# Patient Record
Sex: Male | Born: 1965 | Race: White | Hispanic: No | Marital: Married | Smoking: Never smoker
Health system: Southern US, Community
[De-identification: ages and names within clinical notes are randomized; demographics above are authoritative.]

## PROBLEM LIST (undated history)

## (undated) DIAGNOSIS — I639 Cerebral infarction, unspecified: Secondary | ICD-10-CM

## (undated) DIAGNOSIS — M549 Dorsalgia, unspecified: Secondary | ICD-10-CM

## (undated) DIAGNOSIS — I10 Essential (primary) hypertension: Secondary | ICD-10-CM

## (undated) DIAGNOSIS — M1990 Unspecified osteoarthritis, unspecified site: Secondary | ICD-10-CM

## (undated) DIAGNOSIS — E119 Type 2 diabetes mellitus without complications: Secondary | ICD-10-CM

## (undated) DIAGNOSIS — G4730 Sleep apnea, unspecified: Secondary | ICD-10-CM

## (undated) HISTORY — PX: CARDIAC CATHETERIZATION: SHX172

---

## 2004-06-30 ENCOUNTER — Inpatient Hospital Stay (HOSPITAL_COMMUNITY): Admission: AD | Admit: 2004-06-30 | Discharge: 2004-07-02 | Payer: Self-pay | Admitting: Cardiology

## 2004-06-30 ENCOUNTER — Ambulatory Visit: Payer: Self-pay | Admitting: Cardiology

## 2004-06-30 ENCOUNTER — Encounter: Payer: Self-pay | Admitting: Emergency Medicine

## 2004-07-14 ENCOUNTER — Ambulatory Visit: Payer: Self-pay

## 2006-02-09 ENCOUNTER — Emergency Department (HOSPITAL_COMMUNITY): Admission: EM | Admit: 2006-02-09 | Discharge: 2006-02-09 | Payer: Self-pay | Admitting: Emergency Medicine

## 2006-02-10 ENCOUNTER — Ambulatory Visit: Payer: Self-pay | Admitting: Cardiovascular Disease

## 2006-02-10 ENCOUNTER — Emergency Department (HOSPITAL_COMMUNITY): Admission: EM | Admit: 2006-02-10 | Discharge: 2006-02-10 | Payer: Self-pay | Admitting: Emergency Medicine

## 2014-01-18 ENCOUNTER — Emergency Department (HOSPITAL_COMMUNITY): Payer: BC Managed Care – PPO

## 2014-01-18 ENCOUNTER — Emergency Department (HOSPITAL_COMMUNITY)
Admission: EM | Admit: 2014-01-18 | Discharge: 2014-01-18 | Disposition: A | Discharge status: Home / Self Care | Payer: BC Managed Care – PPO | Attending: Emergency Medicine | Admitting: Emergency Medicine

## 2014-01-18 ENCOUNTER — Encounter (HOSPITAL_COMMUNITY): Payer: Self-pay | Admitting: Emergency Medicine

## 2014-01-18 DIAGNOSIS — M549 Dorsalgia, unspecified: Secondary | ICD-10-CM | POA: Diagnosis present

## 2014-01-18 DIAGNOSIS — M545 Low back pain, unspecified: Secondary | ICD-10-CM | POA: Diagnosis not present

## 2014-01-18 DIAGNOSIS — M62838 Other muscle spasm: Secondary | ICD-10-CM | POA: Insufficient documentation

## 2014-01-18 DIAGNOSIS — M62830 Muscle spasm of back: Secondary | ICD-10-CM

## 2014-01-18 HISTORY — DX: Type 2 diabetes mellitus without complications: E11.9

## 2014-01-18 HISTORY — DX: Essential (primary) hypertension: I10

## 2014-01-18 MED ORDER — METHOCARBAMOL 1000 MG/10ML IJ SOLN
1000.0000 mg | Freq: Once | INTRAMUSCULAR | Status: DC
  Filled 2014-01-18: qty 10

## 2014-01-18 MED ORDER — METHOCARBAMOL 500 MG PO TABS
1000.0000 mg | ORAL_TABLET | Freq: Once | ORAL | Status: AC
  Administered 2014-01-18: 1000 mg via ORAL
  Filled 2014-01-18: qty 2

## 2014-01-18 MED ORDER — KETOROLAC TROMETHAMINE 30 MG/ML IJ SOLN
30.0000 mg | Freq: Once | INTRAMUSCULAR | Status: AC
  Administered 2014-01-18: 30 mg via INTRAVENOUS
  Filled 2014-01-18: qty 1

## 2014-01-18 MED ORDER — DIAZEPAM 5 MG/ML IJ SOLN
5.0000 mg | Freq: Once | INTRAMUSCULAR | Status: AC
Start: 2014-01-18 — End: 2014-01-18
  Administered 2014-01-18: 5 mg via INTRAVENOUS
  Filled 2014-01-18: qty 2

## 2014-01-18 MED ORDER — METHOCARBAMOL 500 MG PO TABS: 500.0000 mg | ORAL_TABLET | Freq: Two times a day (BID) | ORAL | Status: DC

## 2014-01-18 MED ORDER — NAPROXEN 500 MG PO TABS: 500.0000 mg | ORAL_TABLET | Freq: Two times a day (BID) | ORAL | Status: DC

## 2014-01-18 MED ORDER — OXYCODONE-ACETAMINOPHEN 5-325 MG PO TABS: 1.0000 | ORAL_TABLET | Freq: Four times a day (QID) | ORAL | Status: DC | PRN

## 2014-01-18 MED ORDER — OXYCODONE-ACETAMINOPHEN 5-325 MG PO TABS
1.0000 | ORAL_TABLET | Freq: Four times a day (QID) | ORAL | Status: DC | PRN
Start: 2014-01-18 — End: 2014-02-14

## 2014-01-18 MED ORDER — DEXTROSE 5 % IV SOLN
1000.0000 mg | Freq: Once | INTRAVENOUS | Status: AC
  Administered 2014-01-18: 1000 mg via INTRAVENOUS
  Filled 2014-01-18: qty 10

## 2014-01-18 MED ORDER — HYDROMORPHONE HCL PF 1 MG/ML IJ SOLN
1.0000 mg | Freq: Once | INTRAMUSCULAR | Status: AC
  Administered 2014-01-18: 1 mg via INTRAVENOUS
  Filled 2014-01-18: qty 1

## 2014-01-18 MED ORDER — METHOCARBAMOL 500 MG PO TABS: 500.0000 mg | ORAL_TABLET | Freq: Once | ORAL | Status: DC

## 2014-01-18 MED ORDER — LORAZEPAM 2 MG/ML IJ SOLN: 1.0000 mg | Freq: Once | INTRAMUSCULAR | Status: DC

## 2014-01-18 NOTE — ED Notes (Signed)
Patient being transported to MRI, by transporter.

## 2014-01-18 NOTE — ED Notes (Signed)
Awaiting pt arrival from [facility].

## 2014-01-18 NOTE — ED Notes (Signed)
Pt transported to MRI 

## 2014-01-18 NOTE — ED Provider Notes (Signed)
Medical screening examination/treatment/procedure(s) were performed by non-physician practitioner and as supervising physician I was immediately available for consultation/collaboration.   EKG Interpretation None       Geovanni Rahming M Jenavie Stanczak, MD 01/18/14 0630 

## 2014-01-18 NOTE — ED Notes (Signed)
Patient ambulated from his room to the restroom with aid of a walker for safety. Gait steady . States "it feels much better" denies feeling weakness in his legs. Able to sit on toilet and stand without assistance. Tolerated well.

## 2014-01-18 NOTE — ED Notes (Signed)
MRI reports will be here to transport pt in 10 minutes to MRI.

## 2014-01-18 NOTE — ED Notes (Signed)
Pt being transported to CT at present time.

## 2014-01-18 NOTE — ED Notes (Addendum)
Pt lying on side. Pt denies pain at present time but reports pain is positional and has pain with laying on back.

## 2014-01-18 NOTE — ED Notes (Signed)
Bed: [id number] Expected date:  Expected time:  Means of arrival:  Comments: EMS 48yo M, fall while playing paintball, back, knee and shoulder pain

## 2014-01-18 NOTE — ED Provider Notes (Signed)
CSN: [id number]     Arrival date & time 01/18/14  0219 History   First MD Initiated Contact with Patient 01/18/14 0221     Chief Complaint  Patient presents with  . Fall  . Back Pain    (Consider location/radiation/quality/duration/timing/severity/associated sxs/prior Treatment) HPI Comments: Patient is a 48 year old male with a history of diabetes mellitus and hypertension who presents to the emergency department for further evaluation of low back pain. Patient states that he was playing laser tag with his grandchildren when he slid down an incline on his knees, only stopping when he hit a wall with his right shoulder. Patient states that this occurred yesterday at approximately 2200. He states he went to dinner afterwards and was able to ambulate without difficulty. Patient states the pain acutely worsened earlier this morning. Patient states that pain is constant and stabbing in nature. It is sharp and without alleviating factors. Patient states that any movement of his legs worsens his back pain. He also endorses worsening with palpation to his low back. Patient has been experiencing some subjective numbness in his bilateral feet different from his peripheral neuropathy. He denies associated head trauma, loss of consciousness, complete loss of sensation in his limbs, perianal numbness, genital anesthesia, and bowel/bladder incontinence. He denies a hx of low back pain.  Patient is a 48 y.o. male presenting with fall and back pain. The history is provided by the patient. No language interpreter was used.  Fall Associated symptoms include numbness.  Back Pain Associated symptoms: numbness     Past Medical History  Diagnosis Date  . Hypertension   . Diabetes mellitus without complication    History reviewed. No pertinent past surgical history. History reviewed. No pertinent family history. History  Substance Use Topics  . Smoking status: Never Smoker   . Smokeless tobacco: Never Used    . Alcohol Use: Yes    Review of Systems  Genitourinary:       No incontinence  Musculoskeletal: Positive for back pain.  Neurological: Positive for numbness. Negative for syncope.     Allergies  Review of patient's allergies indicates no known allergies.  Home Medications   Prior to Admission medications   Medication Sig Start Date End Date Taking? Authorizing Provider  atorvastatin (LIPITOR) 10 MG tablet Take 10 mg by mouth as directed. Patient takes cyclicly for 2 days then off 1 day   Yes Historical Provider, MD  lisinopril-hydrochlorothiazide (PRINZIDE,ZESTORETIC) 10-12.5 MG per tablet Take 1 tablet by mouth daily.   Yes Historical Provider, MD  metFORMIN (GLUCOPHAGE) 500 MG tablet Take 500 mg by mouth 2 (two) times daily with a meal.   Yes Historical Provider, MD  Multiple Vitamin (MULTIVITAMIN WITH MINERALS) TABS tablet Take 1 tablet by mouth daily. 50 plus.   Yes Historical Provider, MD   BP 118/72  Pulse 66  Temp(Src) 98.5 F (36.9 C) (Oral)  Resp 20  SpO2 97%  Physical Exam  Nursing note and vitals reviewed. Constitutional: He is oriented to person, place, and time. He appears well-developed and well-nourished. No distress.  Nontoxic/nonseptic appearing. Patient very uncomfortable in exam room bed.  HENT:  Head: Normocephalic and atraumatic.  Eyes: Conjunctivae and EOM are normal. No scleral icterus.  Neck: Normal range of motion.  Cardiovascular: Normal rate, regular rhythm and intact distal pulses.   DP and PT pulses b/l  Pulmonary/Chest: Effort normal. No respiratory distress.  Musculoskeletal: He exhibits tenderness.       Lumbar back: He exhibits decreased range of  motion, tenderness, bony tenderness and pain. He exhibits no deformity.       Back:  Physical exam limited as any palpation to low back causes extreme discomfort. No bony deformities or step offs noted.  Neurological: He is alert and oriented to person, place, and time. He exhibits normal  muscle tone. Coordination normal.  Sensation to light touch intact in b/l lower extremities.  Skin: Skin is warm and dry. No rash noted. He is not diaphoretic. No erythema. No pallor.  Psychiatric: He has a normal mood and affect. His behavior is normal.    ED Course  Procedures (including critical care time) Labs Review Labs Reviewed - No data to display  Imaging Review Dg Lumbar Spine Complete  01/18/2014   CLINICAL DATA:  Fall, back pain  EXAM: LUMBAR SPINE - COMPLETE 4+ VIEW  COMPARISON:  None.  FINDINGS: Five non rib-bearing lumbar type vertebral bodies are present. Vertebral bodies are normally aligned with preservation of the normal lumbar lordosis. There is mild anterior wedging of the T11, T12, and L1 vertebral bodies, favored to be chronic in nature. No definite acute fracture identified. There is no listhesis.  Multilevel degenerative disc disease is evidenced by intervertebral disc space narrowing, endplate sclerosis, and osteophytosis is seen, most prevalent at T12-L1 and L3-4. Multilevel facet arthropathy present within the lower lumbar spine.  No paraspinous soft tissue abnormality.  IMPRESSION: 1. Mild anterior wedging deformity of the T11, T12, and L1 vertebral bodies, favored to be chronic in nature. Correlation with physical exam and site of pain is recommended. In addition, further evaluation with CT could be performed if findings remain equivocal. 2. Moderate multilevel degenerative disc disease as above.   Electronically Signed   By: Rise Mu M.D.   On: 01/18/2014 03:57   Ct Lumbar Spine Wo Contrast  01/18/2014   CLINICAL DATA:  Fall, back pain.  EXAM: CT LUMBAR SPINE WITHOUT CONTRAST  TECHNIQUE: Multidetector CT imaging of the lumbar spine was performed without intravenous contrast administration. Multiplanar CT image reconstructions were also generated.  COMPARISON:  Prior radiograph performed earlier on the same day.  FINDINGS: For the purposes of this dictation,  the lowest well-formed intervertebral disc spaces presumed to be the L5-S1 level, and there presumed to be 5 lumbar type vertebral bodies.  Vertebral bodies are normally aligned with preservation of the normal lumbar lordosis. Previously seen mild anterior wedging of the T11, T12, and L1 vertebral bodies is again seen, which appears chronic on this study. No cortical irregularity to suggest acute fracture identified. Vertebral body heights are otherwise preserved.  Prominent bridging anterior osteophytes seen within the lower thoracic and upper lumbar spine anteriorly. Degenerative intervertebral disc space narrowing present at multiple levels. There is degenerative vacuum disc phenomena at L3-4, L4-5, and L5-S1. Degenerative disc bulging present at L4-5 and L5-S1.  No paraspinous hematoma or other abnormality.  SI joints are approximated.  IMPRESSION: 1. No CT evidence of acute traumatic injury within the lumbar spine. Previously seen mild anterior wedging deformity of the T11, T12, and L1 vertebral bodies appears chronic in nature. 2. Moderate multilevel degenerative disc disease throughout the lumbar spine.   Electronically Signed   By: Rise Mu M.D.   On: 01/18/2014 04:49     EKG Interpretation None      MDM   Final diagnoses:  Spasm of back muscles  Low back pain without sciatica, unspecified back pain laterality    48 year old male presents to the emergency department for back pain  after a fall while playing laser tag with his grandchildren. Physical exam significant for tenderness to lumbar paraspinal muscles bilaterally. No bony deformities or step-offs palpated. Patient neurovascularly intact on exam. No gross sensory deficits. No red flags or signs concerning for cauda equina.  Imaging today is negative for fracture, dislocation, or bony deformity. Symptoms consistent with muscle spasms/strain of low back. Patient treated in ED with Toradol, Dilaudid, and Valium. As a result of  these medications, patient is currently very drowsy. He states his pain has greatly improved while at rest, though he still has some discomfort with movement and palpation. Will continue with symptom management with oral Robaxin.  [facility]-C at shift change who will reevaluate patient after effects of Valium have worn off. Anticipate d/c with Rx for pain medication, NSAIDs, and Robaxin if patient able to ambulate in ED.   Filed Vitals:   01/18/14 0219 01/18/14 0220  BP: 135/78 118/72  Pulse: 85 66  Temp:  98.5 F (36.9 C)  TempSrc:  Oral  Resp:  20  SpO2: 97% 97%      Antony MaduraKelly Davin Archuletta, PA-C 01/18/14 [phone]

## 2014-01-18 NOTE — ED Notes (Addendum)
Pt arrived to the ED with a complaint of a fall with a resulting lower back, right shoulder and right leg injury.  Pt was playing laser tag when he came to decline.  Pt then fell and slid down the incline on his knees stopping when he hit a wall with his shoulder.  Pt did this around 2200 hours yesterday.  Pt then went to dinner.  Pt began experiencing pain later mostly in his lower back.  Pt states he is having difficulty using his right leg.  Pt states the pain is his shoulder is not as bad as his back.  Pt was given 250 mcg of fentanyl in route by EMS

## 2014-01-18 NOTE — ED Provider Notes (Signed)
[facility] after complaining of back pain after a fall playing laser tag with R leg weakness.   3:24 PM Pt has just returned from MRI, reporting 8/10 low back pain, R>L. He reports nml sensation in feet but dec strength in R leg.   4:12 PM MRI without abnml cord signal or acute findings. Will d/c home per PA-Humes originally instructions w/ norco, naproxen, and robaxin.   1. Spasm of back muscles   2. Low back pain without sciatica, unspecified back pain laterality      Shanna CiscoMegan E Docherty, MD 01/18/14 [phone]

## 2014-01-18 NOTE — ED Notes (Signed)
[facility] transporting pt at present time.

## 2014-01-18 NOTE — ED Notes (Signed)
Called [facility]ri and they are aware of the patient. They request a weight on pt.

## 2014-01-18 NOTE — ED Notes (Signed)
[facility]oke to [facility]. They advise will be about 30 more minutes before they are ready for pt

## 2014-01-18 NOTE — ED Provider Notes (Signed)
6:08 AM Patient signed out to me by Philip MaduraKelly Humes, PA-C. Patient will be discharged when he is able to ambulate after pain medication wears off.   8:13 AM Patient is able to stand and reports feeling "much better." Patient will be discharged with pain medication. Patient instructed to follow up with PCP or return to the ED with worsening or concerning symptoms. No bladder/bowel incontinence or saddle paresthesias.   9:02 AM Patient reports dragging his right leg when he walks. Patient reports "my right leg isn't doing what I tell it to do." Patient's lower extremity sensation intact, however, right leg strength severely limited. Patient will have MR lumbar and thoracic spine and CT head.   Patient's CT head unremarkable for acute changes. Patient is too large to fit in the MRI machine. [facility]. [facility] is aware the patient will be coming there.   No results found for this or any previous visit. Dg Lumbar Spine Complete  01/18/2014   CLINICAL DATA:  Fall, back pain  EXAM: LUMBAR SPINE - COMPLETE 4+ VIEW  COMPARISON:  None.  FINDINGS: Five non rib-bearing lumbar type vertebral bodies are present. Vertebral bodies are normally aligned with preservation of the normal lumbar lordosis. There is mild anterior wedging of the T11, T12, and L1 vertebral bodies, favored to be chronic in nature. No definite acute fracture identified. There is no listhesis.  Multilevel degenerative disc disease is evidenced by intervertebral disc space narrowing, endplate sclerosis, and osteophytosis is seen, most prevalent at T12-L1 and L3-4. Multilevel facet arthropathy present within the lower lumbar spine.  No paraspinous soft tissue abnormality.  IMPRESSION: 1. Mild anterior wedging deformity of the T11, T12, and L1 vertebral bodies, favored to be chronic in nature. Correlation with physical exam and site of pain is recommended. In addition, further  evaluation with CT could be performed if findings remain equivocal. 2. Moderate multilevel degenerative disc disease as above.   Electronically Signed   By: Rise MuBenjamin  McClintock M.D.   On: 01/18/2014 03:57   Ct Head Wo Contrast  01/18/2014   CLINICAL DATA:  Philip Beasley yesterday. Hit back of head. Right leg weakness and numbness. Diabetic. Hypertension.  EXAM: CT HEAD WITHOUT CONTRAST  TECHNIQUE: Contiguous axial images were obtained from the base of the skull through the vertex without intravenous contrast.  COMPARISON:  11/04/2005.  FINDINGS: No skull fracture or intracranial hemorrhage.  No CT evidence of large acute infarct.  No hydrocephalus.  No intracranial mass lesion noted on this unenhanced exam.  Mastoid air cells, middle ear cavities and visualized paranasal sinuses are clear.  Orbital structures unremarkable.  IMPRESSION: No acute abnormality.  Please see above.   Electronically Signed   By: Bridgett LarssonSteve  Olson M.D.   On: 01/18/2014 10:33   Ct Lumbar Spine Wo Contrast  01/18/2014   CLINICAL DATA:  Fall, back pain.  EXAM: CT LUMBAR SPINE WITHOUT CONTRAST  TECHNIQUE: Multidetector CT imaging of the lumbar spine was performed without intravenous contrast administration. Multiplanar CT image reconstructions were also generated.  COMPARISON:  Prior radiograph performed earlier on the same day.  FINDINGS: For the purposes of this dictation, the lowest well-formed intervertebral disc spaces presumed to be the L5-S1 level, and there presumed to be 5 lumbar type vertebral bodies.  Vertebral bodies are normally aligned with preservation of the normal lumbar lordosis. Previously seen mild anterior wedging of the T11, T12, and L1 vertebral bodies is again  seen, which appears chronic on this study. No cortical irregularity to suggest acute fracture identified. Vertebral body heights are otherwise preserved.  Prominent bridging anterior osteophytes seen within the lower thoracic and upper lumbar spine anteriorly.  Degenerative intervertebral disc space narrowing present at multiple levels. There is degenerative vacuum disc phenomena at L3-4, L4-5, and L5-S1. Degenerative disc bulging present at L4-5 and L5-S1.  No paraspinous hematoma or other abnormality.  SI joints are approximated.  IMPRESSION: 1. No CT evidence of acute traumatic injury within the lumbar spine. Previously seen mild anterior wedging deformity of the T11, T12, and L1 vertebral bodies appears chronic in nature. 2. Moderate multilevel degenerative disc disease throughout the lumbar spine.   Electronically Signed   By: Rise Mu M.D.   On: 01/18/2014 04:49      [name], PA-C 01/18/14 0815  [name], PA-C 01/18/14 1523

## 2014-01-18 NOTE — ED Provider Notes (Signed)
Medical screening examination/treatment/procedure(s) were performed by non-physician practitioner and as supervising physician I was immediately available for consultation/collaboration.   EKG Interpretation None       Octavian Godek M Mak Bonny, MD 01/18/14 1558 

## 2014-01-18 NOTE — Discharge Instructions (Signed)
Back Pain, Adult °Low back pain is very common. About 1 in 5 people have back pain. The cause of low back pain is rarely dangerous. The pain often gets better over time. About half of people with a sudden onset of back pain feel better in just 2 weeks. About 8 in 10 people feel better by 6 weeks.  °CAUSES °Some common causes of back pain include: °· Strain of the muscles or ligaments supporting the spine. °· Wear and tear (degeneration) of the spinal discs. °· Arthritis. °· Direct injury to the back. °DIAGNOSIS °Most of the time, the direct cause of low back pain is not known. However, back pain can be treated effectively even when the exact cause of the pain is unknown. Answering your caregiver's questions about your overall health and symptoms is one of the most accurate ways to make sure the cause of your pain is not dangerous. If your caregiver needs more information, he or she may order lab work or imaging tests (X-rays or MRIs). However, even if imaging tests show changes in your back, this usually does not require surgery. °HOME CARE INSTRUCTIONS °For many people, back pain returns. Since low back pain is rarely dangerous, it is often a condition that people can learn to manage on their own.  °· Remain active. It is stressful on the back to sit or stand in one place. Do not sit, drive, or stand in one place for more than 30 minutes at a time. Take short walks on level surfaces as soon as pain allows. Try to increase the length of time you walk each day. °· Do not stay in bed. Resting more than 1 or 2 days can delay your recovery. °· Do not avoid exercise or work. Your body is made to move. It is not dangerous to be active, even though your back may hurt. Your back will likely heal faster if you return to being active before your pain is gone. °· Pay attention to your body when you  bend and lift. Many people have less discomfort when lifting if they bend their knees, keep the load close to their bodies, and  avoid twisting. Often, the most comfortable positions are those that put less stress on your recovering back. °· Find a comfortable position to sleep. Use a firm mattress and lie on your side with your knees slightly bent. If you lie on your back, put a pillow under your knees. °· Only take over-the-counter or prescription medicines as directed by your caregiver. Over-the-counter medicines to reduce pain and inflammation are often the most helpful. Your caregiver may prescribe muscle relaxant drugs. These medicines help dull your pain so you can more quickly return to your normal activities and healthy exercise. °· Put ice on the injured area. °· Put ice in a plastic bag. °· Place a towel between your skin and the bag. °· Leave the ice on for 15-20 minutes, 03-04 times a day for the first 2 to 3 days. After that, ice and heat may be alternated to reduce pain and spasms. °· Ask your caregiver about trying back exercises and gentle massage. This may be of some benefit. °· Avoid feeling anxious or stressed. Stress increases muscle tension and can worsen back pain. It is important to recognize when you are anxious or stressed and learn ways to manage it. Exercise is a great option. °SEEK MEDICAL CARE IF: °· You have pain that is not relieved with rest or medicine. °· You have pain that does not improve in 1 week. °· You have new symptoms. °· You are generally not feeling well. °SEEK   IMMEDIATE MEDICAL CARE IF:  °· You have pain that radiates from your back into your legs. °· You develop new bowel or bladder control problems. °· You have unusual weakness or numbness in your arms or legs. °· You develop nausea or vomiting. °· You develop abdominal pain. °· You feel faint. °Document Released: 06/08/2005 Document Revised: 12/08/2011 Document Reviewed: 10/10/2013 °ExitCare® Patient Information ©2015 [employer]. This information is not intended to replace advice given to you by your health care provider. Make sure you  discuss any questions you have with your health care provider. ° °Muscle Cramps and Spasms °Muscle cramps and spasms occur when a muscle or muscles tighten and you have no control over this tightening (involuntary muscle contraction). They are a common problem and can develop in any muscle. The most common place is in the calf muscles of the leg. Both muscle cramps and muscle spasms are involuntary muscle contractions, but they also have differences:  °· Muscle cramps are sporadic and painful. They may last a few seconds to a quarter of an hour. Muscle cramps are often more forceful and last longer than muscle spasms. °· Muscle spasms may or may not be painful. They may also last just a few seconds or much longer. °CAUSES  °It is uncommon for cramps or spasms to be due to a serious underlying problem. In many cases, the cause of cramps or spasms is unknown. Some common causes are:  °· Overexertion.   °· Overuse from repetitive motions (doing the same thing over and over).   °· Remaining in a certain position for a long period of time.   °· Improper preparation, form, or technique while performing a sport or activity.   °· Dehydration.   °· Injury.   °· Side effects of some medicines.   °· Abnormally low levels of the salts and ions in your blood (electrolytes), especially potassium and calcium. This could happen if you are taking water pills (diuretics) or you are pregnant.   °Some underlying medical problems can make it more likely to develop cramps or spasms. These include, but are not limited to:  °· Diabetes.   °· Parkinson disease.   °· Hormone disorders, such as thyroid problems.   °· Alcohol abuse.   °· Diseases specific to muscles, joints, and bones.   °· Blood vessel disease where not enough blood is getting to the muscles.   °HOME CARE INSTRUCTIONS  °· Stay well hydrated. Drink enough water and fluids to keep your urine clear or pale yellow. °· It may be helpful to massage, stretch, and relax the affected  muscle. °· For tight or tense muscles, use a warm towel, heating pad, or hot shower water directed to the affected area. °· If you are sore or have pain after a cramp or spasm, applying ice to the affected area may relieve discomfort. °¨ Put ice in a plastic bag. °¨ Place a towel between your skin and the bag. °¨ Leave the ice on for 15-20 minutes, 03-04 times a day. °· Medicines used to treat a known cause of cramps or spasms may help reduce their frequency or severity. Only take over-the-counter or prescription medicines as directed by your caregiver. °SEEK MEDICAL CARE IF:  °Your cramps or spasms get more severe, more frequent, or do not improve over time.  °MAKE SURE YOU:  °· Understand these instructions. °· Will watch your condition. °· Will get help right away if you are not doing well or get worse. °Document Released: 11/28/2001 Document Revised: 10/03/2012 Document Reviewed: 05/25/2012 °ExitCare® Patient Information ©2015 [employer].   This information is not intended to replace advice given to you by your health care provider. Make sure you discuss any questions you have with your health care provider. ° °

## 2014-01-18 NOTE — ED Notes (Addendum)
IV not documented by previous shift de accessed at present time.

## 2014-01-18 NOTE — ED Notes (Addendum)
Pt ambulatory but reports unable to lift right leg. Pt denies pain in right foot but reports shooting pain to lower back with weight bearing to right foot and leg. [facility] and reports will reassess pt with possible additional tests prior to discharge.

## 2014-01-31 ENCOUNTER — Encounter (INDEPENDENT_AMBULATORY_CARE_PROVIDER_SITE_OTHER): Payer: Self-pay | Admitting: General Surgery

## 2014-01-31 ENCOUNTER — Ambulatory Visit (INDEPENDENT_AMBULATORY_CARE_PROVIDER_SITE_OTHER): Payer: BC Managed Care – PPO | Admitting: General Surgery

## 2014-01-31 DIAGNOSIS — IMO0001 Reserved for inherently not codable concepts without codable children: Secondary | ICD-10-CM

## 2014-01-31 DIAGNOSIS — G4733 Obstructive sleep apnea (adult) (pediatric): Secondary | ICD-10-CM

## 2014-01-31 DIAGNOSIS — E119 Type 2 diabetes mellitus without complications: Secondary | ICD-10-CM

## 2014-01-31 DIAGNOSIS — Z794 Long term (current) use of insulin: Secondary | ICD-10-CM

## 2014-01-31 NOTE — Progress Notes (Signed)
Subjective:   Morbid obesity  Patient ID: Philip Beasley, male   DOB: August 26, 1965, 48 y.o.   MRN: [id number]  HPI Philip Beasley y.o.male presents for consideration for surgical treatment for morbid obesity.  He  gives a history of progressive obesity since Early adolescence despite multiple attempts at medical management. He has been able to lose up to 100 pounds at a time but then has progressive weight regain. his weight has been affecting him in a number of ways including The onset of insulin dependent diabetes mellitus approximately 6 years ago.  He also suffers from obstructive sleep apnea and has chronic joint pain. He is very concerned about his health owing to forward at his current weight. His wife has had successful gastric bypass surgery in [place] in 2001 markedly improved health..   He has been to our initial information seminar, researched surgical options thoroughly and is interested in Gastric bypass  Past Medical History  Diagnosis Date  . Hypertension   . Diabetes mellitus without complication    History reviewed. No pertinent past surgical history. Current Outpatient Prescriptions  Medication Sig Dispense Refill  . atorvastatin (LIPITOR) 10 MG tablet Take 10 mg by mouth as directed. Patient takes cyclicly for 2 days then off 1 day      . lisinopril-hydrochlorothiazide (PRINZIDE,ZESTORETIC) 10-12.5 MG per tablet Take 1 tablet by mouth daily.      . metFORMIN (GLUCOPHAGE) 500 MG tablet Take 500 mg by mouth 2 (two) times daily with a meal.      . methocarbamol (ROBAXIN) 500 MG tablet Take 1 tablet (500 mg total) by mouth 2 (two) times daily.  20 tablet  0  . Multiple Vitamin (MULTIVITAMIN WITH MINERALS) TABS tablet Take 1 tablet by mouth daily. 50 plus.      . naproxen (NAPROSYN) 500 MG tablet Take 1 tablet (500 mg total) by mouth 2 (two) times daily.  30 tablet  0  . oxyCODONE-acetaminophen (PERCOCET/ROXICET) 5-325 MG per tablet Take 1-2 tablets by mouth every 6 (six) hours as  needed for moderate pain or severe pain.  17 tablet  0   No current facility-administered medications for this visit.   No Known Allergies History  Substance Use Topics  . Smoking status: Never Smoker   . Smokeless tobacco: Never Used  . Alcohol Use: No       Review of Systems General: No fever malaise recent infections Respiratory: Denies shortness of breath cough wheezing.  Has sleep apnea and uses CPAP Cardiac: History of cardiac catheter after episode of chest pain. Reports negative and was noncardiac chest pain. No current palpitations swelling or chest pain Abdomen: Denies abdominal pain, reflux, change in bowel habits, liver problems or hepatitis Extremities: Positive for chronic joint pain GU: Negative Endocrine: Positive some dependent diabetes mellitus    Objective:   Physical Exam General: Alert, well-developed Morbidly obese Caucasian male, in no distress Skin: Warm and dry without rash or infection. HEENT: No palpable masses or thyromegaly. Sclera nonicteric. Pupils equal round and reactive. Oropharynx clear. Lymph nodes: No cervical, supraclavicular, or inguinal nodes palpable. Lungs: Breath sounds clear and equal without increased work of breathing Cardiovascular: Regular rate and rhythm without murmur. No JVD or edema. Peripheral pulses intact. Abdomen: Nondistended. Soft and nontender. No masses palpable. No organomegaly. No palpable hernias. Extremities: No edema or joint swelling or deformity. No chronic venous stasis changes. Neurologic: Alert and fully oriented. Gait normal.    Assessment:     Patient with progressive morbid obesity unresponsive  to multiple efforts at medical management who presents with a BMI of 39.7 and comorbidities of Insulin-dependent diabetes mellitus, obstructive sleep apnea, hypertension and chronic joint pain.. I believe there would be very significant medical benefit from surgical weight loss. After our discussion of surgical  options currently available the patient has decided to proceed with laparoscopic Roux-en-Y gastric bypass due to the reasons above. We have discussed the nature of the problem and the risks of remaining morbidly obese. We discussed laparoscopic Roux-en-Y gastric bypass in detail including the nature of the procedure, expected hospitalization and recovery, and major risks of anesthetic complications, bleeding, blood clots and pulmonary emboli, leakage and infection and long-term risks of stricture, ulceration, bowel obstruction, nutritional deficiencies, and failure to lose weight or weight regain. We discussed these problems could lead to death. The patient was given a complete consent form to review and all questions were answered. We will go ahead with preoperative including psychological and nutrition evaluations, H. pylori testing, ultrasound, bone density, and routine lab and x-rays. I will see the patient back following these studies.    Plan:     Begin workup for planned laparoscopic Roux-en-Y gastric bypass as above

## 2014-02-14 ENCOUNTER — Inpatient Hospital Stay (HOSPITAL_COMMUNITY)
Admission: EM | Admit: 2014-02-14 | Discharge: 2014-02-16 | DRG: 069 | Disposition: A | Discharge status: Home / Self Care | Payer: BC Managed Care – PPO | Attending: Internal Medicine | Admitting: Internal Medicine

## 2014-02-14 ENCOUNTER — Emergency Department (HOSPITAL_COMMUNITY): Payer: BC Managed Care – PPO

## 2014-02-14 ENCOUNTER — Encounter (HOSPITAL_COMMUNITY): Payer: Self-pay | Admitting: Emergency Medicine

## 2014-02-14 DIAGNOSIS — R29810 Facial weakness: Secondary | ICD-10-CM | POA: Diagnosis present

## 2014-02-14 DIAGNOSIS — E119 Type 2 diabetes mellitus without complications: Secondary | ICD-10-CM | POA: Diagnosis present

## 2014-02-14 DIAGNOSIS — R1310 Dysphagia, unspecified: Secondary | ICD-10-CM | POA: Diagnosis present

## 2014-02-14 DIAGNOSIS — I639 Cerebral infarction, unspecified: Secondary | ICD-10-CM

## 2014-02-14 DIAGNOSIS — W19XXXA Unspecified fall, initial encounter: Secondary | ICD-10-CM | POA: Diagnosis present

## 2014-02-14 DIAGNOSIS — E669 Obesity, unspecified: Secondary | ICD-10-CM | POA: Diagnosis present

## 2014-02-14 DIAGNOSIS — G2581 Restless legs syndrome: Secondary | ICD-10-CM | POA: Diagnosis present

## 2014-02-14 DIAGNOSIS — E781 Pure hyperglyceridemia: Secondary | ICD-10-CM | POA: Diagnosis present

## 2014-02-14 DIAGNOSIS — Z6839 Body mass index (BMI) 39.0-39.9, adult: Secondary | ICD-10-CM

## 2014-02-14 DIAGNOSIS — G459 Transient cerebral ischemic attack, unspecified: Secondary | ICD-10-CM | POA: Diagnosis not present

## 2014-02-14 DIAGNOSIS — G510 Bell's palsy: Secondary | ICD-10-CM

## 2014-02-14 DIAGNOSIS — I440 Atrioventricular block, first degree: Secondary | ICD-10-CM | POA: Diagnosis present

## 2014-02-14 DIAGNOSIS — R29898 Other symptoms and signs involving the musculoskeletal system: Secondary | ICD-10-CM | POA: Diagnosis present

## 2014-02-14 DIAGNOSIS — Z823 Family history of stroke: Secondary | ICD-10-CM | POA: Diagnosis not present

## 2014-02-14 DIAGNOSIS — I10 Essential (primary) hypertension: Secondary | ICD-10-CM | POA: Diagnosis present

## 2014-02-14 HISTORY — DX: Cerebral infarction, unspecified: I63.9

## 2014-02-14 LAB — RAPID URINE DRUG SCREEN, HOSP PERFORMED
AMPHETAMINES: NOT DETECTED
BARBITURATES: NOT DETECTED
Benzodiazepines: NOT DETECTED
Cocaine: NOT DETECTED
Opiates: NOT DETECTED
TETRAHYDROCANNABINOL: NOT DETECTED

## 2014-02-14 LAB — URINALYSIS, ROUTINE W REFLEX MICROSCOPIC
Bilirubin Urine: NEGATIVE
HGB URINE DIPSTICK: NEGATIVE
KETONES UR: NEGATIVE mg/dL
Leukocytes, UA: NEGATIVE
Nitrite: NEGATIVE
PROTEIN: NEGATIVE mg/dL
Specific Gravity, Urine: 1.026 (ref 1.005–1.030)
Urobilinogen, UA: 0.2 mg/dL (ref 0.0–1.0)
pH: 5.5 (ref 5.0–8.0)

## 2014-02-14 LAB — COMPREHENSIVE METABOLIC PANEL
ALT: 23 U/L (ref 0–53)
ANION GAP: 14 (ref 5–15)
AST: 18 U/L (ref 0–37)
Albumin: 3.7 g/dL (ref 3.5–5.2)
Alkaline Phosphatase: 84 U/L (ref 39–117)
BILIRUBIN TOTAL: 0.3 mg/dL (ref 0.3–1.2)
BUN: 19 mg/dL (ref 6–23)
CHLORIDE: 101 meq/L (ref 96–112)
CO2: 22 mEq/L (ref 19–32)
CREATININE: 0.59 mg/dL (ref 0.50–1.35)
Calcium: 9.1 mg/dL (ref 8.4–10.5)
GFR calc Af Amer: >90 mL/min (ref >90)
GFR calc non Af Amer: >90 mL/min (ref >90)
Glucose, Bld: 235 mg/dL — ABNORMAL HIGH (ref 70–99)
Potassium: 4.1 mEq/L (ref 3.7–5.3)
Sodium: 137 mEq/L (ref 137–147)
TOTAL PROTEIN: 7.2 g/dL (ref 6.0–8.3)

## 2014-02-14 LAB — I-STAT TROPONIN, ED: Troponin i, poc: 0 ng/mL (ref 0.00–0.08)

## 2014-02-14 LAB — DIFFERENTIAL
BASOS PCT: 0 % (ref 0–1)
Basophils Absolute: 0 10*3/uL (ref 0.0–0.1)
EOS ABS: 0.4 10*3/uL (ref 0.0–0.7)
Eosinophils Relative: 5 % (ref 0–5)
LYMPHS ABS: 2.6 10*3/uL (ref 0.7–4.0)
Lymphocytes Relative: 35 % (ref 12–46)
MONO ABS: 0.6 10*3/uL (ref 0.1–1.0)
Monocytes Relative: 8 % (ref 3–12)
Neutro Abs: 3.9 10*3/uL (ref 1.7–7.7)
Neutrophils Relative %: 52 % (ref 43–77)

## 2014-02-14 LAB — URINE MICROSCOPIC-ADD ON

## 2014-02-14 LAB — I-STAT VENOUS BLOOD GAS, ED
Bicarbonate: 25.9 mEq/L — ABNORMAL HIGH (ref 20.0–24.0)
O2 Saturation: 68 %
PCO2 VEN: 43.4 mmHg — AB (ref 45.0–50.0)
PH VEN: 7.383 — AB (ref 7.250–7.300)
PO2 VEN: 36 mmHg (ref 30.0–45.0)
TCO2: 27 mmol/L (ref 0–100)

## 2014-02-14 LAB — CBC
HEMATOCRIT: 37.8 % — AB (ref 39.0–52.0)
Hemoglobin: 13.8 g/dL (ref 13.0–17.0)
MCH: 30.9 pg (ref 26.0–34.0)
MCHC: 36.5 g/dL — AB (ref 30.0–36.0)
MCV: 84.6 fL (ref 78.0–100.0)
Platelets: 176 10*3/uL (ref 150–400)
RBC: 4.47 MIL/uL (ref 4.22–5.81)
RDW: 13.2 % (ref 11.5–15.5)
WBC: 7.4 10*3/uL (ref 4.0–10.5)

## 2014-02-14 LAB — GLUCOSE, CAPILLARY: GLUCOSE-CAPILLARY: 202 mg/dL — AB (ref 70–99)

## 2014-02-14 LAB — PROTIME-INR
INR: 1 (ref 0.00–1.49)
Prothrombin Time: 13.2 seconds (ref 11.6–15.2)

## 2014-02-14 LAB — APTT: aPTT: 30 seconds (ref 24–37)

## 2014-02-14 MED ORDER — ENOXAPARIN SODIUM 40 MG/0.4ML ~~LOC~~ SOLN
40.0000 mg | SUBCUTANEOUS | Status: DC
  Administered 2014-02-14 – 2014-02-15 (×2): 40 mg via SUBCUTANEOUS
  Filled 2014-02-14 (×2): qty 0.4

## 2014-02-14 MED ORDER — SODIUM CHLORIDE 0.9 % IJ SOLN
3.0000 mL | Freq: Two times a day (BID) | INTRAMUSCULAR | Status: DC
  Administered 2014-02-15: 3 mL via INTRAVENOUS

## 2014-02-14 MED ORDER — INSULIN ASPART 100 UNIT/ML ~~LOC~~ SOLN
0.0000 [IU] | Freq: Every day | SUBCUTANEOUS | Status: DC
  Administered 2014-02-14: 2 [IU] via SUBCUTANEOUS

## 2014-02-14 MED ORDER — SODIUM CHLORIDE 0.9 % IV SOLN
INTRAVENOUS | Status: DC
  Administered 2014-02-14: 1000 mL via INTRAVENOUS

## 2014-02-14 MED ORDER — POLYETHYLENE GLYCOL 3350 17 G PO PACK: 17.0000 g | PACK | Freq: Every day | ORAL | Status: DC | PRN

## 2014-02-14 MED ORDER — STROKE: EARLY STAGES OF RECOVERY BOOK
Freq: Once | Status: AC
  Administered 2014-02-14: 22:00:00
  Filled 2014-02-14: qty 1

## 2014-02-14 MED ORDER — ONDANSETRON HCL 4 MG/2ML IJ SOLN: 4.0000 mg | Freq: Four times a day (QID) | INTRAMUSCULAR | Status: DC | PRN

## 2014-02-14 MED ORDER — LISINOPRIL-HYDROCHLOROTHIAZIDE 10-12.5 MG PO TABS: 1.0000 | ORAL_TABLET | Freq: Every day | ORAL | Status: DC

## 2014-02-14 MED ORDER — HYDROCHLOROTHIAZIDE 12.5 MG PO CAPS
12.5000 mg | ORAL_CAPSULE | Freq: Every day | ORAL | Status: DC
  Administered 2014-02-16: 12.5 mg via ORAL
  Filled 2014-02-14: qty 1

## 2014-02-14 MED ORDER — LISINOPRIL 10 MG PO TABS
10.0000 mg | ORAL_TABLET | Freq: Every day | ORAL | Status: DC
  Administered 2014-02-16: 10 mg via ORAL
  Filled 2014-02-14: qty 1

## 2014-02-14 MED ORDER — ALUM & MAG HYDROXIDE-SIMETH 200-200-20 MG/5ML PO SUSP: 30.0000 mL | Freq: Four times a day (QID) | ORAL | Status: DC | PRN

## 2014-02-14 MED ORDER — ASPIRIN EC 325 MG PO TBEC: 325.0000 mg | DELAYED_RELEASE_TABLET | Freq: Every day | ORAL | Status: DC

## 2014-02-14 MED ORDER — ASPIRIN 325 MG PO TABS: 325.0000 mg | ORAL_TABLET | Freq: Every day | ORAL | Status: DC

## 2014-02-14 MED ORDER — PRAMIPEXOLE DIHYDROCHLORIDE 0.25 MG PO TABS
0.2500 mg | ORAL_TABLET | Freq: Every day | ORAL | Status: DC
  Administered 2014-02-15: 0.25 mg via ORAL
  Filled 2014-02-14 (×4): qty 1

## 2014-02-14 MED ORDER — ONDANSETRON HCL 4 MG PO TABS: 4.0000 mg | ORAL_TABLET | Freq: Four times a day (QID) | ORAL | Status: DC | PRN

## 2014-02-14 MED ORDER — ASPIRIN 300 MG RE SUPP
300.0000 mg | Freq: Every day | RECTAL | Status: DC
  Administered 2014-02-14 – 2014-02-15 (×2): 300 mg via RECTAL
  Filled 2014-02-14 (×2): qty 1

## 2014-02-14 MED ORDER — LORAZEPAM 2 MG/ML IJ SOLN
1.0000 mg | Freq: Once | INTRAMUSCULAR | Status: DC
  Filled 2014-02-14: qty 1

## 2014-02-14 MED ORDER — TIZANIDINE HCL 4 MG PO TABS
4.0000 mg | ORAL_TABLET | Freq: Three times a day (TID) | ORAL | Status: DC | PRN
  Filled 2014-02-14 (×2): qty 1

## 2014-02-14 MED ORDER — INSULIN ASPART 100 UNIT/ML ~~LOC~~ SOLN
0.0000 [IU] | Freq: Three times a day (TID) | SUBCUTANEOUS | Status: DC
  Administered 2014-02-15: 2 [IU] via SUBCUTANEOUS
  Administered 2014-02-15: 5 [IU] via SUBCUTANEOUS
  Administered 2014-02-16 (×2): 3 [IU] via SUBCUTANEOUS

## 2014-02-14 MED ORDER — HYDROCODONE-ACETAMINOPHEN 5-325 MG PO TABS: 1.0000 | ORAL_TABLET | ORAL | Status: DC | PRN

## 2014-02-14 NOTE — H&P (Signed)
Physician Admission History and Physical     PCP:   Alysia Penna, MD   Chief Complaint:  L sided facial droop   HPI: Arne Schlender is an 48 y.o. male. Pt presents for L sided facial droop that started about 3 days ago. May have some R sided weakness. Also claims metallic taste. He has hx of DM, HTN, HLD. He also has some mild R sided weakness, but his hand squeeze is weak B/L. In the ED his CT head showed NAICA to explain the symptoms. Facial palsy was present above and below the eyeline on the L side. He claims that his swallow is somewhat impaired but no choking at this point. Did have some dysphagia today in the ED.   Review of Systems:  Neg except as noted above  Past Medical History: Past Medical History  Diagnosis Date  . Hypertension   . Diabetes mellitus without complication    History reviewed. No pertinent past surgical history.  Medications: Prior to Admission medications   Medication Sig Start Date End Date Taking? Authorizing Provider  acetaminophen (TYLENOL) 500 MG tablet Take 500 mg by mouth every 6 (six) hours as needed for moderate pain.   Yes Historical Provider, MD  aspirin-acetaminophen-caffeine (EXCEDRIN MIGRAINE) [id number] MG per tablet Take 1 tablet by mouth every 6 (six) hours as needed for headache.   Yes Historical Provider, MD  diphenhydrAMINE (BENADRYL) 25 MG tablet Take 25 mg by mouth every 6 (six) hours as needed for itching or allergies.   Yes Historical Provider, MD  lisinopril-hydrochlorothiazide (PRINZIDE,ZESTORETIC) 10-12.5 MG per tablet Take 1 tablet by mouth daily.   Yes Historical Provider, MD  metFORMIN (GLUCOPHAGE) 500 MG tablet Take 500 mg by mouth 2 (two) times daily with a meal.   Yes Historical Provider, MD  methocarbamol (ROBAXIN) 500 MG tablet Take 500 mg by mouth at bedtime as needed for muscle spasms.   Yes Historical Provider, MD  naproxen (NAPROSYN) 500 MG tablet Take 500 mg by mouth at bedtime as needed for moderate pain.   Yes  Historical Provider, MD  tiZANidine (ZANAFLEX) 4 MG tablet Take 4 mg by mouth 2 (two) times daily as needed. 02/14/14  Yes Historical Provider, MD    Allergies:  No Known Allergies  Social History:  reports that he has never smoked. He has never used smokeless tobacco. He reports that he does not drink alcohol or use illicit drugs.  Family History: History reviewed. No pertinent family history.  Physical Exam: Filed Vitals:   02/14/14 1715 02/14/14 1730 02/14/14 1745 02/14/14 1800  BP: 102/61 109/69 117/70 111/81  Pulse: 63 59 64 73  Temp:      TempSrc:      Resp: 15 20 16 20   SpO2: 96% 96% 96% 95%   Gen: WM in NAD . Obese  HEENT: MMM. Asymmetrical smile. Weak lid close on L.  Lungs: CTAB, no wheezes, rales  Cardio:  RRR, no MRG  Abd:  Soft, NT, ND  MSK:  3/5 strength on RUE , 4/5 strength on LUE  Neuro: Impairment on L of CN II-XII Skin: scar on L upper forearm     Labs on Admission:   Recent Labs  02/14/14 1505  NA 137  K 4.1  CL 101  CO2 22  GLUCOSE 235*  BUN 19  CREATININE 0.59  CALCIUM 9.1    Recent Labs  02/14/14 1505  AST 18  ALT 23  ALKPHOS 84  BILITOT 0.3  PROT 7.2  ALBUMIN  3.7   No results found for this basename: LIPASE, AMYLASE,  in the last 72 hours  Recent Labs  02/14/14 1505  WBC 7.4  NEUTROABS 3.9  HGB 13.8  HCT 37.8*  MCV 84.6  PLT 176   No results found for this basename: CKTOTAL, CKMB, CKMBINDEX, TROPONINI,  in the last 72 hours Lab Results  Component Value Date   INR 1.00 02/14/2014   No results found for this basename: TSH, T4TOTAL, FREET3, T3FREE, THYROIDAB,  in the last 72 hours No results found for this basename: VITAMINB12, FOLATE, FERRITIN, TIBC, IRON, RETICCTPCT,  in the last 72 hours  Radiological Exams on Admission: Dg Chest 2 View  02/14/2014   CLINICAL DATA:  Altered mental status.  EXAM: CHEST  2 VIEW  COMPARISON:  Single view of the chest 02/09/2006 CT chest 07/01/2004.  FINDINGS: Lung volumes are low but  the lungs are clear. Heart size is normal. No pneumothorax or pleural effusion.  IMPRESSION: No acute finding in a low volume chest.   Electronically Signed   By: Drusilla Kanner M.D.   On: 02/14/2014 18:39   Dg Lumbar Spine Complete  01/18/2014   CLINICAL DATA:  Fall, back pain  EXAM: LUMBAR SPINE - COMPLETE 4+ VIEW  COMPARISON:  None.  FINDINGS: Five non rib-bearing lumbar type vertebral bodies are present. Vertebral bodies are normally aligned with preservation of the normal lumbar lordosis. There is mild anterior wedging of the T11, T12, and L1 vertebral bodies, favored to be chronic in nature. No definite acute fracture identified. There is no listhesis.  Multilevel degenerative disc disease is evidenced by intervertebral disc space narrowing, endplate sclerosis, and osteophytosis is seen, most prevalent at T12-L1 and L3-4. Multilevel facet arthropathy present within the lower lumbar spine.  No paraspinous soft tissue abnormality.  IMPRESSION: 1. Mild anterior wedging deformity of the T11, T12, and L1 vertebral bodies, favored to be chronic in nature. Correlation with physical exam and site of pain is recommended. In addition, further evaluation with CT could be performed if findings remain equivocal. 2. Moderate multilevel degenerative disc disease as above.   Electronically Signed   By: Rise Mu M.D.   On: 01/18/2014 03:57   Ct Head (brain) Wo Contrast  02/14/2014   CLINICAL DATA:  Numbness and facial droop  EXAM: CT HEAD WITHOUT CONTRAST  TECHNIQUE: Contiguous axial images were obtained from the base of the skull through the vertex without intravenous contrast.  COMPARISON:  Noncontrast CT scan of the brain of January 18, 2014  FINDINGS: The ventricles are normal in size and position. There is no intracranial hemorrhage nor intracranial mass effect. No acute ischemic change is demonstrated. There are mild deep white matter hypodensities in both cerebral hemispheres. The cerebellum and  brainstem are normal.  The observed paranasal sinuses and mastoid air cells are clear. There is no acute skull fracture nor lytic or blastic bony lesion. New caps the internal auditory canals exhibit normal caliber.  IMPRESSION: 1. There is no acute intracranial hemorrhage nor evidence of acute ischemic change or mass effect. 2. There are white matter hypodensities consistent with chronic small vessel ischemia. 3. There is no acute skull fracture.   Electronically Signed   By: David  [place]   On: 02/14/2014 16:35   Ct Head Wo Contrast  01/18/2014   CLINICAL DATA:  Larey Seat yesterday. Hit back of head. Right leg weakness and numbness. Diabetic. Hypertension.  EXAM: CT HEAD WITHOUT CONTRAST  TECHNIQUE: Contiguous axial images were obtained from  the base of the skull through the vertex without intravenous contrast.  COMPARISON:  11/04/2005.  FINDINGS: No skull fracture or intracranial hemorrhage.  No CT evidence of large acute infarct.  No hydrocephalus.  No intracranial mass lesion noted on this unenhanced exam.  Mastoid air cells, middle ear cavities and visualized paranasal sinuses are clear.  Orbital structures unremarkable.  IMPRESSION: No acute abnormality.  Please see above.   Electronically Signed   By: Bridgett Larsson M.D.   On: 01/18/2014 10:33   Ct Lumbar Spine Wo Contrast  01/18/2014   CLINICAL DATA:  Fall, back pain.  EXAM: CT LUMBAR SPINE WITHOUT CONTRAST  TECHNIQUE: Multidetector CT imaging of the lumbar spine was performed without intravenous contrast administration. Multiplanar CT image reconstructions were also generated.  COMPARISON:  Prior radiograph performed earlier on the same day.  FINDINGS: For the purposes of this dictation, the lowest well-formed intervertebral disc spaces presumed to be the L5-S1 level, and there presumed to be 5 lumbar type vertebral bodies.  Vertebral bodies are normally aligned with preservation of the normal lumbar lordosis. Previously seen mild anterior wedging of the  T11, T12, and L1 vertebral bodies is again seen, which appears chronic on this study. No cortical irregularity to suggest acute fracture identified. Vertebral body heights are otherwise preserved.  Prominent bridging anterior osteophytes seen within the lower thoracic and upper lumbar spine anteriorly. Degenerative intervertebral disc space narrowing present at multiple levels. There is degenerative vacuum disc phenomena at L3-4, L4-5, and L5-S1. Degenerative disc bulging present at L4-5 and L5-S1.  No paraspinous hematoma or other abnormality.  SI joints are approximated.  IMPRESSION: 1. No CT evidence of acute traumatic injury within the lumbar spine. Previously seen mild anterior wedging deformity of the T11, T12, and L1 vertebral bodies appears chronic in nature. 2. Moderate multilevel degenerative disc disease throughout the lumbar spine.   Electronically Signed   By: Rise Mu M.D.   On: 01/18/2014 04:49   Mr Thoracic Spine Wo Contrast  01/18/2014   CLINICAL DATA:  Back pain after a fall.  EXAM: MRI THORACIC AND LUMBAR SPINE WITHOUT CONTRAST  TECHNIQUE: Multiplanar and multiecho pulse sequences of the thoracic and lumbar spine were obtained without intravenous contrast.  COMPARISON:  None.  FINDINGS: MR THORACIC SPINE FINDINGS  No acute fracture is identified. Scattered Schmorl's nodes are noted, most prominent in the anterior, superior endplate of T4. Hemangioma is seen in T9 and there is some scattered degenerative endplate signal change in the lower thoracic spine. No worrisome marrow lesion is identified. Thoracic kyphosis appears exaggerated. Multilevel facet arthropathy is noted. The thoracic cord demonstrates normal signal. Imaged paraspinous structures are unremarkable.  T3-4:  Mild disc bulge without central canal or foraminal narrowing.  T4-5: Small left paracentral protrusion without central canal or foraminal narrowing.  T5-6: Small left paracentral protrusion without central canal or  foraminal narrowing.  T6-7: Minimal disc bulge.  The central canal and foramina are open.  T7-8: Negative.  T8-9:  Negative.  T9-10: Tiny right paracentral protrusion without central canal or foraminal narrowing.  T10-11:  Negative.  T11-12: Negative.  T12-L1:  Negative.  MR LUMBAR SPINE FINDINGS  Vertebral body height and alignment are maintained. Scattered, mild degenerative endplate signal change is noted but no worrisome marrow lesion is identified. The thoracic cord demonstrates normal signal. Imaged intra-abdominal contents are unremarkable.  L1-2: Minimal disc bulge. The central spinal canal neural foramina are widely patent.  L2-3: Minimal disc bulge without central canal or foraminal  stenosis.  L3-4: Minimal disc bulge without central canal or foraminal stenosis.  L4-5: Shallow disc bulge without central canal or foraminal narrowing.  L5-S1: Shallow disc bulge endplate spur identified. There is some narrowing in the lateral recesses, more notable on the left. There is also moderate left foraminal narrowing. The right foramen is open.  IMPRESSION: MR THORACIC SPINE IMPRESSION  Negative for fracture or other acute abnormality in the thoracic spine.  Overall mild thoracic degenerative change without central canal or foraminal stenosis.  MR LUMBAR SPINE IMPRESSION  Negative for fracture or other acute abnormality in the lumbar spine.  Overall mild multilevel degenerative change most notable at L5-S1 as detailed above.   Electronically Signed   By: Drusilla Kanner M.D.   On: 01/18/2014 15:44   Mr Lumbar Spine Wo Contrast  01/18/2014   CLINICAL DATA:  Back pain after a fall.  EXAM: MRI THORACIC AND LUMBAR SPINE WITHOUT CONTRAST  TECHNIQUE: Multiplanar and multiecho pulse sequences of the thoracic and lumbar spine were obtained without intravenous contrast.  COMPARISON:  None.  FINDINGS: MR THORACIC SPINE FINDINGS  No acute fracture is identified. Scattered Schmorl's nodes are noted, most prominent in the  anterior, superior endplate of T4. Hemangioma is seen in T9 and there is some scattered degenerative endplate signal change in the lower thoracic spine. No worrisome marrow lesion is identified. Thoracic kyphosis appears exaggerated. Multilevel facet arthropathy is noted. The thoracic cord demonstrates normal signal. Imaged paraspinous structures are unremarkable.  T3-4:  Mild disc bulge without central canal or foraminal narrowing.  T4-5: Small left paracentral protrusion without central canal or foraminal narrowing.  T5-6: Small left paracentral protrusion without central canal or foraminal narrowing.  T6-7: Minimal disc bulge.  The central canal and foramina are open.  T7-8: Negative.  T8-9:  Negative.  T9-10: Tiny right paracentral protrusion without central canal or foraminal narrowing.  T10-11:  Negative.  T11-12: Negative.  T12-L1:  Negative.  MR LUMBAR SPINE FINDINGS  Vertebral body height and alignment are maintained. Scattered, mild degenerative endplate signal change is noted but no worrisome marrow lesion is identified. The thoracic cord demonstrates normal signal. Imaged intra-abdominal contents are unremarkable.  L1-2: Minimal disc bulge. The central spinal canal neural foramina are widely patent.  L2-3: Minimal disc bulge without central canal or foraminal stenosis.  L3-4: Minimal disc bulge without central canal or foraminal stenosis.  L4-5: Shallow disc bulge without central canal or foraminal narrowing.  L5-S1: Shallow disc bulge endplate spur identified. There is some narrowing in the lateral recesses, more notable on the left. There is also moderate left foraminal narrowing. The right foramen is open.  IMPRESSION: MR THORACIC SPINE IMPRESSION  Negative for fracture or other acute abnormality in the thoracic spine.  Overall mild thoracic degenerative change without central canal or foraminal stenosis.  MR LUMBAR SPINE IMPRESSION  Negative for fracture or other acute abnormality in the lumbar  spine.  Overall mild multilevel degenerative change most notable at L5-S1 as detailed above.   Electronically Signed   By: Drusilla Kanner M.D.   On: 01/18/2014 15:44   Orders placed during the hospital encounter of 02/14/14  . EKG 12-LEAD  . EKG 12-LEAD  . ED EKG  . ED EKG    Assessment/Plan Active Problems:   Facial droop  - CVA vs bells palsy. Neuro consult and appreciate their recs. Duration of ssx is 3 days. MRI brian. CUS. TTE. Tele. A1C, lipids. Cont home meds. ASA  started . PT ordered  DM - stop metformin while inpatient. SSI while inpatient HLD - on not daily statin at home. No statin in house  HTN - home meds  RLS - start mirapex   Dispo - home in 1-3 days   Meredyth Hornung 02/14/2014, 7:34 PM

## 2014-02-14 NOTE — ED Provider Notes (Signed)
CSN: [id number]     Arrival date & time 02/14/14  1452 History   First MD Initiated Contact with Patient 02/14/14 1627     Chief Complaint  Patient presents with  . Numbness  . Facial Droop     (Consider location/radiation/quality/duration/timing/severity/associated sxs/prior Treatment) HPI  Philip Beasley is a 48 y.o. male with past medical history of hypertension, diabetes, and prior Bell's palsy presenting for stroke-like symptoms. Per patient's spouse he's had intermittent episodes of facial drooping and confusion for the past 2-3 days. Patient appears more confused in the morning but gets better throughout the day. Denies any recent infections, nausea, vomiting, fever, chills, headache, or chest pain.  Patient does have mild dyspnea on exertion which has been episodic for some time. Has a history of obstructive sleep apnea previously on CPAP currently not using CPAP due to improvement of symptoms following weight loss.  Patient had some right-sided facial symptoms a couple days ago and has had changes in taste on the right side of his tongue. Today spouse noted some left-sided facial droop. Also the patient awoke with some blurred vision in his left eye, and a left eye dryness which improved during the morning.  No amaurosis fugax.      Past Medical History  Diagnosis Date  . Hypertension   . Diabetes mellitus without complication    History reviewed. No pertinent past surgical history. History reviewed. No pertinent family history. History  Substance Use Topics  . Smoking status: Never Smoker   . Smokeless tobacco: Never Used  . Alcohol Use: No    Review of Systems  HENT: Positive for trouble swallowing.   Eyes: Negative.   Respiratory: Positive for shortness of breath.   Cardiovascular: Negative.   Gastrointestinal: Negative.   Endocrine: Negative.   Genitourinary: Negative.   Musculoskeletal: Negative.   Skin: Negative.   Allergic/Immunologic: Negative.    Neurological: Positive for facial asymmetry, speech difficulty and weakness.  Hematological: Negative.   Psychiatric/Behavioral: Positive for confusion.      Allergies  Review of patient's allergies indicates no known allergies.  Home Medications   Prior to Admission medications   Medication Sig Start Date End Date Taking? Authorizing Provider  acetaminophen (TYLENOL) 500 MG tablet Take 500 mg by mouth every 6 (six) hours as needed for moderate pain.   Yes Historical Provider, MD  aspirin-acetaminophen-caffeine (EXCEDRIN MIGRAINE) [phone] MG per tablet Take 1 tablet by mouth every 6 (six) hours as needed for headache.   Yes Historical Provider, MD  diphenhydrAMINE (BENADRYL) 25 MG tablet Take 25 mg by mouth every 6 (six) hours as needed for itching or allergies.   Yes Historical Provider, MD  lisinopril-hydrochlorothiazide (PRINZIDE,ZESTORETIC) 10-12.5 MG per tablet Take 1 tablet by mouth daily.   Yes Historical Provider, MD  metFORMIN (GLUCOPHAGE) 500 MG tablet Take 500 mg by mouth 2 (two) times daily with a meal.   Yes Historical Provider, MD  methocarbamol (ROBAXIN) 500 MG tablet Take 500 mg by mouth at bedtime as needed for muscle spasms.   Yes Historical Provider, MD  naproxen (NAPROSYN) 500 MG tablet Take 500 mg by mouth at bedtime as needed for moderate pain.   Yes Historical Provider, MD  tiZANidine (ZANAFLEX) 4 MG tablet Take 4 mg by mouth 2 (two) times daily as needed. 02/14/14  Yes Historical Provider, MD   BP 139/88  Pulse 75  Temp(Src) 98 F (36.7 C) (Oral)  Resp 18  SpO2 96% Physical Exam  Nursing note and vitals reviewed. Constitutional:  He appears well-developed and well-nourished. No distress.  HENT:  Head: Normocephalic and atraumatic.  Right Ear: External ear normal.  Left Ear: External ear normal.  Nose: Nose normal.  Mouth/Throat: Oropharynx is clear and moist. No oropharyngeal exudate.  Eyes: Conjunctivae and EOM are normal. Pupils are equal, round, and  reactive to light. Right eye exhibits no discharge. Left eye exhibits no discharge. No scleral icterus.  Neck: Normal range of motion. Neck supple. No JVD present. No tracheal deviation present. No thyromegaly present.  Cardiovascular: Normal rate, regular rhythm, normal heart sounds and intact distal pulses.  Exam reveals no gallop and no friction rub.   No murmur heard. Pulmonary/Chest: Effort normal and breath sounds normal. No stridor. No respiratory distress. He has no wheezes. He has no rales. He exhibits no tenderness.  Abdominal: Soft. Bowel sounds are normal. He exhibits no distension. There is no tenderness. There is no rebound and no guarding.  Musculoskeletal: Normal range of motion. He exhibits no edema and no tenderness.  Lymphadenopathy:    He has no cervical adenopathy.  Neurological: He is alert. He is disoriented. He displays no atrophy and no tremor. A cranial nerve deficit and sensory deficit is present. He exhibits abnormal muscle tone. He displays no seizure activity. GCS eye subscore is 4. GCS verbal subscore is 5. GCS motor subscore is 6.  L facial droop.  Appears to include forehead.  Decreased L eyelid strength.  R arm strength 4/5 vs 5/5 on L.  BL LE symmetric.  Skin: Skin is warm and dry. No rash noted. He is not diaphoretic. No erythema. No pallor.  Psychiatric: He has a normal mood and affect. His behavior is normal. Judgment and thought content normal.    ED Course  Procedures (including critical care time) Labs Review Labs Reviewed  CBC - Abnormal; Notable for the following:    HCT 37.8 (*)    MCHC 36.5 (*)    All other components within normal limits  COMPREHENSIVE METABOLIC PANEL - Abnormal; Notable for the following:    Glucose, Bld 235 (*)    All other components within normal limits  URINALYSIS, ROUTINE W REFLEX MICROSCOPIC - Abnormal; Notable for the following:    Glucose, UA >1000 (*)    All other components within normal limits  BASIC METABOLIC  PANEL - Abnormal; Notable for the following:    Glucose, Bld 145 (*)    All other components within normal limits  CBC - Abnormal; Notable for the following:    HCT 38.4 (*)    All other components within normal limits  LIPID PANEL - Abnormal; Notable for the following:    Triglycerides 445 (*)    HDL 28 (*)    All other components within normal limits  HEMOGLOBIN A1C - Abnormal; Notable for the following:    Hemoglobin A1C 7.8 (*)    Mean Plasma Glucose 177 (*)    All other components within normal limits  GLUCOSE, CAPILLARY - Abnormal; Notable for the following:    Glucose-Capillary 202 (*)    All other components within normal limits  GLUCOSE, CAPILLARY - Abnormal; Notable for the following:    Glucose-Capillary 137 (*)    All other components within normal limits  GLUCOSE, CAPILLARY - Abnormal; Notable for the following:    Glucose-Capillary 137 (*)    All other components within normal limits  GLUCOSE, CAPILLARY - Abnormal; Notable for the following:    Glucose-Capillary 201 (*)    All other components within  normal limits  GLUCOSE, CAPILLARY - Abnormal; Notable for the following:    Glucose-Capillary 151 (*)    All other components within normal limits  I-STAT VENOUS BLOOD GAS, ED - Abnormal; Notable for the following:    pH, Ven 7.383 (*)    pCO2, Ven 43.4 (*)    Bicarbonate 25.9 (*)    All other components within normal limits  PROTIME-INR  APTT  DIFFERENTIAL  URINE RAPID DRUG SCREEN (HOSP PERFORMED)  URINE MICROSCOPIC-ADD ON  Rosezena Sensor, ED    Imaging Review Dg Chest 2 View  02/14/2014   CLINICAL DATA:  Altered mental status.  EXAM: CHEST  2 VIEW  COMPARISON:  Single view of the chest 02/09/2006 CT chest 07/01/2004.  FINDINGS: Lung volumes are low but the lungs are clear. Heart size is normal. No pneumothorax or pleural effusion.  IMPRESSION: No acute finding in a low volume chest.   Electronically Signed   By: Drusilla Kanner M.D.   On: 02/14/2014 18:39    Ct Head (brain) Wo Contrast  02/14/2014   CLINICAL DATA:  Numbness and facial droop  EXAM: CT HEAD WITHOUT CONTRAST  TECHNIQUE: Contiguous axial images were obtained from the base of the skull through the vertex without intravenous contrast.  COMPARISON:  Noncontrast CT scan of the brain of January 18, 2014  FINDINGS: The ventricles are normal in size and position. There is no intracranial hemorrhage nor intracranial mass effect. No acute ischemic change is demonstrated. There are mild deep white matter hypodensities in both cerebral hemispheres. The cerebellum and brainstem are normal.  The observed paranasal sinuses and mastoid air cells are clear. There is no acute skull fracture nor lytic or blastic bony lesion. New caps the internal auditory canals exhibit normal caliber.  IMPRESSION: 1. There is no acute intracranial hemorrhage nor evidence of acute ischemic change or mass effect. 2. There are white matter hypodensities consistent with chronic small vessel ischemia. 3. There is no acute skull fracture.   Electronically Signed   By: David  [place]   On: 02/14/2014 16:35        EKG Interpretation None      MDM   Final diagnoses:  Facial droop   Patient with multiple neurologic symptoms, possibly consistent with new left-sided Bell's palsy, versus TIA or CVA. Low concern for acute CVA, and patient has had symptoms for multiple days and is outside the window for acute intervention. May have underlying metabolic, infectious, or medication-related process causing his symptoms. Will obtain basic studies for evaluation of infection or metabolic abnormalities.  Initial studies negative for specific etiology to explain patient's symptoms. Patient discussed with his PCP Dr. Link Snuffer. Will consult neurology as well for their input. Patient will likely need CVA/TIA workup, with additional studies as indicated based on his multiple neurologic symptoms.  Patient care was discussed with my attending, Dr.  Rubin Payor.  Gavin Pound, MD 02/16/14 [phone]

## 2014-02-14 NOTE — ED Notes (Signed)
Pt reports onset Sunday of numbness sensation to right side of tongue and face, now it has changed to left side of face. Reports having burning pain to left eye and difficulty keeping left eye open since Sunday. Having difficulty drinking fluids due to tongue and face numbness, mild left side facial droop noted at triage, grips are equal and no arm drift noted, speech clear.

## 2014-02-14 NOTE — ED Notes (Signed)
MD resident at bedside

## 2014-02-14 NOTE — Consult Note (Signed)
Neurology Consultation Reason for Consult: right sided weakness and facial  Referring Physician: Holwerda, S  CC: Right-sided weakness, left-sided facial weakness  History is obtained from: Patient  HPI: Twan Harkin is a 48 y.o. male with a history of symptoms that started on Sunday. He states that his been similar symptoms started with the exception that he thinks his right face might have been involved at the very initial stages, but subsequently it became his left face as well as his right arm that were weak. This has now been persistent. He also describes dysphagia  Of note, he had a fall back in July and following this had right leg weakness, but spinal MRI did not show clear injury to explain it.   LKW: Sunday 8/23 tpa given?: no, outside of window    ROS: A 14 point ROS was performed and is negative except as noted in the HPI.   Past Medical History  Diagnosis Date  . Hypertension   . Diabetes mellitus without complication     Family History: Mother stroke  Social History: Tob: Denies  Exam: Current vital signs: BP 135/81  Pulse 61  Temp(Src) 98 F (36.7 C) (Oral)  Resp 18  Ht  (1.88 m)  Wt 140.161 kg (309 lb)  BMI 39.66 kg/m2  SpO2 98% Vital signs in last 24 hours: Temp:  [98 F (36.7 C)] 98 F (36.7 C) (08/26 2000) Pulse Rate:  [57-75] 61 (08/26 2000) Resp:  [13-20] 18 (08/26 2000) BP: (102-139)/(61-88) 135/81 mmHg (08/26 2000) SpO2:  [95 %-98 %] 98 % (08/26 2000) Weight:  [140.161 kg (309 lb)] 140.161 kg (309 lb) (08/26 2000)  General: In bed, NAD CV: Regular rate and rhythm Mental Status: Patient is awake, alert, oriented to person, place, month, year, and situation. Immediate and remote memory are intact. Patient is able to give a clear and coherent history. No signs of aphasia or neglect Cranial Nerves: II: Visual Fields are full. Pupils are equal, round, and reactive to light.  Discs are difficult to visualize. III,IV, VI: EOMI without  ptosis or diploplia.  V: Facial sensation is symmetric to temperature VII: Facial movement is notable for left facial weakness involving the lower and upper face VIII: hearing is intact to voice X: Uvula elevates symmetrically XI: Shoulder shrug is symmetric. XII: tongue is midline without atrophy or fasciculations.  Motor: Tone is normal. Bulk is normal. 5/5 strength was present on the left side, on the right he has mild weakness of his right arm and leg 4/5 Sensory: Sensation is symmetric to light touch and temperature in the legs. In the arm he has mild decrease sensation of her right arm Deep Tendon Reflexes: 2+ and symmetric in the biceps and patellae.  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 Given his risk factors, I suspect that this represents a small brainstem infarct. Given his neck pain, however, as well as history of trauma I would favor ruling out dissection.  I do wonder if his fall was a result of transient right-sided weakness as opposed to cause of his weakness.  Recommendations: 1. HgbA1c, fasting lipid panel 2. MRI, MRA  of the brain without contrast, MRA  neck 3. Frequent neuro checks 4. Echocardiogram 5. Carotid dopplers 6. Prophylactic therapy-Antiplatelet med: Aspirin - dose  PO or  PR 7. Risk factor modification 8. Telemetry monitoring 9. PT consult, OT consult, Speech consult    Ritta Slot, MD [facility] [phone]  If 7pm- 7am, please page neurology on call as listed in AMION.

## 2014-02-15 ENCOUNTER — Inpatient Hospital Stay (HOSPITAL_COMMUNITY): Payer: BC Managed Care – PPO

## 2014-02-15 DIAGNOSIS — G510 Bell's palsy: Secondary | ICD-10-CM

## 2014-02-15 DIAGNOSIS — G459 Transient cerebral ischemic attack, unspecified: Secondary | ICD-10-CM

## 2014-02-15 DIAGNOSIS — R29810 Facial weakness: Secondary | ICD-10-CM

## 2014-02-15 LAB — BASIC METABOLIC PANEL
ANION GAP: 13 (ref 5–15)
BUN: 13 mg/dL (ref 6–23)
CALCIUM: 8.5 mg/dL (ref 8.4–10.5)
CHLORIDE: 104 meq/L (ref 96–112)
CO2: 24 meq/L (ref 19–32)
Creatinine, Ser: 0.58 mg/dL (ref 0.50–1.35)
GFR calc Af Amer: >90 mL/min (ref >90)
GFR calc non Af Amer: >90 mL/min (ref >90)
Glucose, Bld: 145 mg/dL — ABNORMAL HIGH (ref 70–99)
POTASSIUM: 4.2 meq/L (ref 3.7–5.3)
SODIUM: 141 meq/L (ref 137–147)

## 2014-02-15 LAB — CBC
HCT: 38.4 % — ABNORMAL LOW (ref 39.0–52.0)
HEMOGLOBIN: 13.4 g/dL (ref 13.0–17.0)
MCH: 30.4 pg (ref 26.0–34.0)
MCHC: 34.9 g/dL (ref 30.0–36.0)
MCV: 87.1 fL (ref 78.0–100.0)
Platelets: 172 10*3/uL (ref 150–400)
RBC: 4.41 MIL/uL (ref 4.22–5.81)
RDW: 13.3 % (ref 11.5–15.5)
WBC: 6.6 10*3/uL (ref 4.0–10.5)

## 2014-02-15 LAB — HEMOGLOBIN A1C
Hgb A1c MFr Bld: 7.8 % — ABNORMAL HIGH (ref <5.7)
Mean Plasma Glucose: 177 mg/dL — ABNORMAL HIGH (ref <117)

## 2014-02-15 LAB — GLUCOSE, CAPILLARY
GLUCOSE-CAPILLARY: 137 mg/dL — AB (ref 70–99)
GLUCOSE-CAPILLARY: 137 mg/dL — AB (ref 70–99)
GLUCOSE-CAPILLARY: 151 mg/dL — AB (ref 70–99)
Glucose-Capillary: 201 mg/dL — ABNORMAL HIGH (ref 70–99)

## 2014-02-15 LAB — LIPID PANEL
Cholesterol: 200 mg/dL (ref 0–200)
HDL: 28 mg/dL — AB (ref >39)
LDL Cholesterol: UNDETERMINED mg/dL (ref 0–99)
TRIGLYCERIDES: 445 mg/dL — AB (ref <150)
Total CHOL/HDL Ratio: 7.1 RATIO
VLDL: UNDETERMINED mg/dL (ref 0–40)

## 2014-02-15 MED ORDER — LORAZEPAM 2 MG/ML IJ SOLN
INTRAMUSCULAR | Status: AC
  Filled 2014-02-15: qty 1

## 2014-02-15 MED ORDER — GADOBENATE DIMEGLUMINE 529 MG/ML IV SOLN
20.0000 mL | Freq: Once | INTRAVENOUS | Status: AC | PRN
  Administered 2014-02-15: 20 mL via INTRAVENOUS

## 2014-02-15 MED ORDER — HYDRALAZINE HCL 20 MG/ML IJ SOLN: 10.0000 mg | Freq: Four times a day (QID) | INTRAMUSCULAR | Status: DC | PRN

## 2014-02-15 MED ORDER — LORAZEPAM 2 MG/ML IJ SOLN
1.0000 mg | Freq: Once | INTRAMUSCULAR | Status: AC
  Administered 2014-02-15: 1 mg via INTRAVENOUS

## 2014-02-15 MED ORDER — MORPHINE SULFATE 2 MG/ML IJ SOLN: 2.0000 mg | INTRAMUSCULAR | Status: DC | PRN

## 2014-02-15 NOTE — Plan of Care (Signed)
Problem: Acute Rehab PT Goals(only PT should resolve) Goal: PT Additional Goal #1 Pt to score >19 on DGI to be at lower risk for falls in community.

## 2014-02-15 NOTE — Evaluation (Signed)
Clinical/Bedside Swallow Evaluation Patient Details  Name: Philip Beasley MRN: [id number] Date of Birth: [birth year]  Today's Date: 02/15/2014 Time: 4098-1191 SLP Time Calculation (min): 15 min  Past Medical History:  Past Medical History  Diagnosis Date  . Hypertension   . Diabetes mellitus without complication    Past Surgical History: History reviewed. No pertinent past surgical history. HPI:  Philip Beasley is a 48 y.o. male with a history of symptoms that started on Sunday. He states that his been similar symptoms started with the exception that he thinks his right face might have been involved at the very initial stages, but subsequently it became his left face as well as his right arm that were weak. This has now been persistent. He also describes dysphagia. 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 Given his risk factors, neurologist suspects that this represents a small brainstem infarct.    Assessment / Plan / Recommendation Clinical Impression  Pt demonstrates a mild oral dysphagia resulting from reduced sensation of right face and cheek as well and reduced right sided labial strength.  Pt quite contientious during assessment; able to masticate small bites functionally without residuals, independently using a labial sweep to affected side and placing cup to the left side with small sips to avoid anterior spillage.  No evidence of aspiraiton observed.   Given that pt has been eating and drinking since symptoms started and currently there is no concern for aspiration or aspiration pna, recommend pt continue a regular diet and thin liquids under supervision, utilizing precautions and strategies. If pt is observed to struggle with tolerance of PO, or MRI result particularly concerning for risk of silent aspiration, will f/u with objective assessment.     Aspiration Risk  Mild    Diet Recommendation Regular;Thin  liquid   Liquid Administration via: Cup;No straw Medication Administration: Whole meds with puree Supervision: Patient able to self feed;Intermittent supervision to cue for compensatory strategies Compensations: Small sips/bites;Slow rate;Check for pocketing;Check for anterior loss Postural Changes and/or Swallow Maneuvers: Seated upright 90 degrees    Other  Recommendations Oral Care Recommendations: Oral care BID   Follow Up Recommendations  24 hour supervision/assistance    Frequency and Duration min 2x/week  2 weeks   Pertinent Vitals/Pain NA    SLP Swallow Goals     Swallow Study Prior Functional Status       General HPI: Philip Beasley is a 48 y.o. male with a history of symptoms that started on Sunday. He states that his been similar symptoms started with the exception that he thinks his right face might have been involved at the very initial stages, but subsequently it became his left face as well as his right arm that were weak. This has now been persistent. He also describes dysphagia. 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 Given his risk factors, neurologist suspects that this represents a small brainstem infarct.  Type of Study: Bedside swallow evaluation Diet Prior to this Study: NPO Temperature Spikes Noted: No Respiratory Status: Room air History of Recent Intubation: No Behavior/Cognition: Alert;Cooperative;Pleasant mood Oral Cavity - Dentition: Adequate natural dentition Self-Feeding Abilities: Able to feed self Patient Positioning: Upright in bed Baseline Vocal Quality: Clear Volitional Cough: Strong Volitional Swallow: Able to elicit    Oral/Motor/Sensory Function Overall Oral Motor/Sensory Function: Impaired Labial ROM: Reduced left Labial Symmetry: Abnormal symmetry left Labial Strength: Reduced Labial Sensation: Reduced Lingual ROM:  Within Functional Limits Lingual  Symmetry: Within Functional Limits Lingual Strength: Within Functional Limits Lingual Sensation: Within Functional Limits Facial ROM: Reduced left Facial Symmetry: Left droop;Left drooping eyelid Facial Strength: Reduced Facial Sensation: Reduced Velum: Within Functional Limits Mandible: Within Functional Limits   Ice Chips     Thin Liquid Thin Liquid: Impaired Presentation: Cup;Straw;Self Fed Oral Phase Impairments: Reduced labial seal Oral Phase Functional Implications: Right anterior spillage    Nectar Thick Nectar Thick Liquid: Not tested   Honey Thick Honey Thick Liquid: Not tested   Puree Puree: Within functional limits   Solid   GO    Solid: Within functional limits Presentation: Self Fed      Philip Ditty, MA CCC-SLP [phone]  Claudine Mouton 02/15/2014,9:42 AM

## 2014-02-15 NOTE — Progress Notes (Signed)
Occupational Therapy Evaluation Patient Details Name: Philip Beasley MRN: [id number] DOB: 07/17/1965 Today's Date: 02/15/2014    History of Present Illness 48 y.o. male with hx of R Bell's palsy and acute fall presenting with new onset R facial and R arm weakness on Sunday, now with what looks like a L Bell's palsy. He did not receive IV t-PA. Initial CT unrevealing.  Stroke work up underway.   Clinical Impression   PTA pt lived at home and was independent with ADLs and functional mobility. He reports a fall at the end of July and resulting RLE weakness making mobility difficult. Pt currently requires min (A) for safe mobility around room due to unsteady balance and reports of dizziness. Educated pt on fall prevention including use of shower seat and sitting for ADLs as well as on signs and symptoms of stroke. Pt would benefit from OT to address balance and Rt UE weakness with written HEP. 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     Follow Up Recommendations  Outpatient OT (neuro)    Equipment Recommendations  Tub/shower seat    Recommendations for Other Services       Precautions / Restrictions Precautions Precautions: None Restrictions Weight Bearing Restrictions: No      Mobility Bed Mobility Overal bed mobility: Modified Independent             General bed mobility comments: use of handrails and HOB elevated  Transfers Overall transfer level: Needs assistance Equipment used: None Transfers: Sit to/from Stand Sit to Stand: Supervision         General transfer comment: Supervision for safety. Slight sway upon initial standing    Balance Overall balance assessment: Needs assistance;History of Falls Sitting-balance support: Feet supported;No upper extremity supported Sitting balance-Leahy Scale: Good     Standing balance support: During functional activity;Single extremity supported Standing balance-Leahy Scale: Poor Standing  balance comment: Rt UE supported due to unsteadiness                             ADL Overall ADL's : Needs assistance/impaired Eating/Feeding: Independent;Sitting   Grooming: Wash/dry hands;Oral care;Min guard;Standing (with UE support on sink)   Upper Body Bathing: Set up;Sitting   Lower Body Bathing: Min guard;Sit to/from stand   Upper Body Dressing : Set up;Sitting   Lower Body Dressing: Min guard;Sit to/from stand Lower Body Dressing Details (indicate cue type and reason): pt able to cross legs in figure four to don socks Toilet Transfer: Minimal assistance;Ambulation (use of IV pole for balance)       Tub/ Shower Transfer: Walk-in shower;Minimal assistance   Functional mobility during ADLs: Minimal assistance (use of IV pole for balance) General ADL Comments: Pt demonstrates balance deficits and drags his Rt foot during functional mobility around room and bathroom which increase his fall risk. 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 Educated pt and wife on signs and symptoms of stroke, fall prevention, and energy conservation principles.      Vision  Pt wears glasses and reports blurring of vision in the morning, which resolves after about 30 minutes.  No apparent visual deficits.              Additional Comments: Pt's report of blurry vision may be related to weakness of L eye closure as he reports this tends to happen in  the morning. Recommended to RN that pt should have eye drops to assist with moisturizing Lt eye.    Perception Perception Perception Tested?: No   Praxis Praxis Praxis tested?: Within functional limits    Pertinent Vitals/Pain Pain Assessment: No/denies pain Pain Score: 3  Pain Location: Lt headache  Pain Descriptors / Indicators: Dull     Hand Dominance Right   Extremity/Trunk Assessment Upper Extremity Assessment Upper Extremity  Assessment: RUE deficits/detail RUE Deficits / Details: grip strength 3+/5 otherwise 4/5 in RUE RUE Sensation: decreased light touch (very slight and appears to be improving)   Lower Extremity Assessment Lower Extremity Assessment: Defer to PT evaluation RLE Deficits / Details: grossly 4/5; c/o pain intermittently from fall  RLE Sensation:  (WFL to light touch; denies numbness )   Cervical / Trunk Assessment Cervical / Trunk Assessment: Normal   Communication Communication Communication: No difficulties   Cognition Arousal/Alertness: Awake/alert Behavior During Therapy: WFL for tasks assessed/performed Overall Cognitive Status: Within Functional Limits for tasks assessed                                Home Living Family/patient expects to be discharged to:: Private residence Living Arrangements: Spouse/significant other;Children Available Help at Discharge: Family;Available 24 hours/day Type of Home: House Home Access: Level entry     Home Layout: One level     Bathroom Shower/Tub: Producer, television/film/video: Standard     Home Equipment: Environmental consultant - 2 wheels (from pt's wife's hx of knee surgery)   Additional Comments: pt has walk in shower       Prior Functioning/Environment Level of Independence: Independent        Comments: pt drives and is active with grandkids     OT Diagnosis: Paresis   OT Problem List: Decreased strength;Impaired balance (sitting and/or standing);Impaired UE functional use;Impaired sensation   OT Treatment/Interventions: Self-care/ADL training;Therapeutic exercise;Balance training;Patient/family education;Therapeutic activities    OT Goals(Current goals can be found in the care plan section) Acute Rehab OT Goals Patient Stated Goal: to go home and back to work OT Goal Formulation: With patient Time For Goal Achievement: 02/22/14 Potential to Achieve Goals: Good ADL Goals Pt Will Transfer to Toilet: with modified  independence;ambulating Pt Will Perform Tub/Shower Transfer: Shower transfer;with modified independence;ambulating;rolling walker Pt/caregiver will Perform Home Exercise Program: Increased strength;Right Upper extremity;With theraband;With theraputty;Independently;With written HEP provided  OT Frequency: Min 2X/week    End of Session Equipment Utilized During Treatment: Gait belt Nurse Communication: Mobility status;Other (comment) (pt needs eye drops)  Activity Tolerance: Patient tolerated treatment well Patient left: in chair;with call bell/phone within reach;with family/visitor present   Time: 4098-1191 OT Time Calculation (min): 40 min Charges:  OT General Charges $OT Visit: 1 Procedure OT Evaluation $Initial OT Evaluation Tier I: 1 Procedure OT Treatments $Self Care/Home Management : 23-37 mins  Rae Lips [phone] 02/15/2014, 2:15 PM

## 2014-02-15 NOTE — Progress Notes (Signed)
Echocardiogram 2D Echocardiogram has been performed.  Philip Beasley 02/15/2014, 10:16 AM

## 2014-02-15 NOTE — Progress Notes (Signed)
Patient in room calm and cooperative. Alert and oriented x4. Family at bedside. Will continue to monitor.

## 2014-02-15 NOTE — Progress Notes (Signed)
EKG performed and in pt chart.

## 2014-02-15 NOTE — Progress Notes (Signed)
Patient HR fluctuating between 30-70. Lowest it has dropped is 36. Patient asleep and calm. No symptoms reported by pt. Dr. Link Snuffer notified. No new orders at this time. Will continue to monitor.

## 2014-02-15 NOTE — Progress Notes (Signed)
STROKE TEAM PROGRESS NOTE   HISTORY Philip Beasley is a 48 y.o. male with a history of symptoms that started on Sunday 02/11/2014, time unknown. He states that his been similar symptoms started with the exception that he thinks his right face might have been involved at the very initial stages, but subsequently it became his left face as well as his right arm that were weak. This has now been persistent. He also describes dysphagia. Of note, he had a fall back in July and following this had right leg weakness, but spinal MRI did not show clear injury to explain it.  Patient was not administered TPA secondary to delay in arrival. He was admitted for further evaluation and treatment.   SUBJECTIVE (INTERVAL HISTORY) His wife is at the bedside.  Overall he feels his condition is stable. Reports right half of face and tongue became numb, notes on awakening Sunday am; also right hand numbness lasted about 1/2 day, then resolved. He has a hx of R Bell's Palsy and he thought that was his problem. Now reports funny feeling on left side of his face. He also reports a strong metalic taste in his mouth. He does report he also fell and hit his head. He denies any focal weakness or slurred speech. No prior history of stroke or TIA. She denies history of tick bites   OBJECTIVE Temp:  [97.7 F (36.5 C)-98.3 F (36.8 C)] 98 F (36.7 C) (08/27 1012) Pulse Rate:  [42-75] 69 (08/27 1012) Cardiac Rhythm:  [-] Sinus bradycardia;Heart block (08/26 2030) Resp:  [13-20] 18 (08/27 1012) BP: (102-139)/(56-88) 133/74 mmHg (08/27 1012) SpO2:  [93 %-99 %] 97 % (08/27 1012) Weight:  [140.161 kg (309 lb)] 140.161 kg (309 lb) (08/26 2000)   Recent Labs Lab 02/14/14 2107 02/15/14 0648  GLUCAP 202* 137*    Recent Labs Lab 02/14/14 1505 02/15/14 0730  NA 137 141  K 4.1 4.2  CL 101 104  CO2 22 24  GLUCOSE 235* 145*  BUN 19 13  CREATININE 0.59 0.58  CALCIUM 9.1 8.5    Recent Labs Lab 02/14/14 1505  AST 18   ALT 23  ALKPHOS 84  BILITOT 0.3  PROT 7.2  ALBUMIN 3.7    Recent Labs Lab 02/14/14 1505 02/15/14 0730  WBC 7.4 6.6  NEUTROABS 3.9  --   HGB 13.8 13.4  HCT 37.8* 38.4*  MCV 84.6 87.1  PLT 176 172   No results found for this basename: CKTOTAL, CKMB, CKMBINDEX, TROPONINI,  in the last 168 hours  Recent Labs  02/14/14 1505  LABPROT 13.2  INR 1.00    Recent Labs  02/14/14 1908  COLORURINE YELLOW  LABSPEC 1.026  PHURINE 5.5  GLUCOSEU >1000*  HGBUR NEGATIVE  BILIRUBINUR NEGATIVE  KETONESUR NEGATIVE  PROTEINUR NEGATIVE  UROBILINOGEN 0.2  NITRITE NEGATIVE  LEUKOCYTESUR NEGATIVE    No results found for this basename: chol, trig, hdl, cholhdl, vldl, ldlcalc   No results found for this basename: HGBA1C      Component Value Date/Time   LABOPIA NONE DETECTED 02/14/2014 1908   COCAINSCRNUR NONE DETECTED 02/14/2014 1908   LABBENZ NONE DETECTED 02/14/2014 1908   AMPHETMU NONE DETECTED 02/14/2014 1908   THCU NONE DETECTED 02/14/2014 1908   LABBARB NONE DETECTED 02/14/2014 1908    No results found for this basename: ETH,  in the last 168 hours  Dg Chest 2 View  02/14/2014   CLINICAL DATA:  Altered mental status.  EXAM: CHEST  2 VIEW  COMPARISON:  Single view of the chest 02/09/2006 CT chest 07/01/2004.  FINDINGS: Lung volumes are low but the lungs are clear. Heart size is normal. No pneumothorax or pleural effusion.  IMPRESSION: No acute finding in a low volume chest.   Electronically Signed   By: Drusilla Kanner M.D.   On: 02/14/2014 18:39   Ct Head (brain) Wo Contrast  02/14/2014   CLINICAL DATA:  Numbness and facial droop  EXAM: CT HEAD WITHOUT CONTRAST  TECHNIQUE: Contiguous axial images were obtained from the base of the skull through the vertex without intravenous contrast.  COMPARISON:  Noncontrast CT scan of the brain of January 18, 2014  FINDINGS: The ventricles are normal in size and position. There is no intracranial hemorrhage nor intracranial mass effect. No acute  ischemic change is demonstrated. There are mild deep white matter hypodensities in both cerebral hemispheres. The cerebellum and brainstem are normal.  The observed paranasal sinuses and mastoid air cells are clear. There is no acute skull fracture nor lytic or blastic bony lesion. New caps the internal auditory canals exhibit normal caliber.  IMPRESSION: 1. There is no acute intracranial hemorrhage nor evidence of acute ischemic change or mass effect. 2. There are white matter hypodensities consistent with chronic small vessel ischemia. 3. There is no acute skull fracture.   Electronically Signed   By: David  [place]   On: 02/14/2014 16:35     PHYSICAL EXAM Pleasant middle-age Caucasian male currently not in distress.Awake alert. Afebrile. Head is nontraumatic. Neck is supple without bruit. Hearing is normal. Cardiac exam no murmur or gallop. Lungs are clear to auscultation. Distal pulses are well felt. Neurological Exam : Awake alert oriented x3 with normal speech and language function. No dysarthria, aphasia or apraxia. Fundi are not visualized. Extraocular moments are full range without nystagmus. Visual acuity seems normal. Mild left facial weakness including eye closure and frowning as well as cheek. Bell's phenomenon is absent. No objective sensory loss on the face or extremities. No focal weakness. Coordination is normal. Gait was not tested. ASSESSMENT/PLAN  Mr. Ranier Coach is a 48 y.o. male with hx of R Bell's palsy and acute fall presenting with new onset R facial and R arm weakness on Sunday, now with what looks like a L Bell's palsy. He did not receive IV t-PA. Initial CT unrevealing.  Stroke work up underway.  Stroke/TIA:  Possible tiny brainstem or deep white matter infarct +/- new L Bell's Palsy (old R Bell's Palsy). Infarct, if present, likely secondary to small vessel disease.      no antithrombotics prior to admission, now on aspirin 325 mg orally every day  MRI pending  MRA head  pending  MRA neck given fall, ? Dissection, pain pending  2D Echo  pending   LDL pending  Lovenox 40 mg sq daily for VTE prophylaxis  Carb Control thin liquids.   Resultant left facial weakness  Therapy needs:  Pending  Consider OP obstructive sleep apnea eval given body habitus as stroke risk factor  Risk factor management/education  Patient counseled to be compliant with his antithrombotic medications  Disposition:  Home with wife  Hypertension   Home meds:  Lisinopril, HCTZ. Resumed in hospital  BP 102-139/61-88 past 24h  SBP goal < 220 from stroke standpoint  Stable  Diabetes  HgbA1c pending  Home Meds: glucophage  Goal < 7.0  Other Stroke Risk Factors   Obesity, Body mass index is 39.66 kg/(m^2).    Family hx stroke (mother)  Other Pertinent History  Hx R Bell's palsy  Acute fall, hit head  Hospital day # 1  [facility] BIBY, MSN, RN, ANVP-BC, ANP-BC, [facility] Pager: [phone] 02/15/2014 11:52 AM  I have personally examined this patient, reviewed notes, independently viewed imaging studies, participated in medical decision making and plan of care. I have made any additions or clarifications directly to the above note. Agree with note above. . The changing numbness on both sides of the face would be highly unlikely from a stroke. He does have clinical signs of left peripheral seventh nerve weakness which would suggest Bell's palsy or a tiny brainstem infarct involving the facial nerve nucleus or nerve in the brainstem. The right face and arm paresthesias are difficult to explain and may be from brainstem or left subcortical TIA from small vessel disease  Delia Heady, MD Medical Director [facility] Pager: [phone] 02/15/2014 4:51 PM    To contact Stroke Continuity provider, please refer to [email or web address]. After hours, contact General Neurology

## 2014-02-15 NOTE — Progress Notes (Signed)
Physician Daily Progress Note  Subjective: Sinus brady overnight while sleeping. Assx. Confirmed on EKG w/ 1st degree AV block   Otherwise no events   Objective: Vital signs in last 24 hours:   Filed Vitals:   02/15/14 0000 02/15/14 0206 02/15/14 0400 02/15/14 0600  BP: 118/65 114/64 102/56 126/85  Pulse: 57 75 54 42  Temp: 97.9 F (36.6 C) 98 F (36.7 C) 97.7 F (36.5 C) 98 F (36.7 C)  TempSrc: Oral Oral Oral Oral  Resp: Height:      Weight:      SpO2: 95% 93% 99% 97%   Weight change:     CBG (last 3)   Recent Labs  02/14/14 2107 02/15/14 0648  GLUCAP 202* 137*    Intake/Output from previous day: No intake or output data in the 24 hours ending 02/15/14 0837    Physical Exam General appearance: WM in NAD  HEENT: MMM, tongue and uvula at midline Resp:  CTAB, no w/r Cardio:sinus brady, no MRG  GI: soft, non-tender; bowel sounds normal; no masses,  no organomegaly Extremities: no clubbing, cyanosis or edema Neuro: CN II-V impaired in on L, 3/5 strength on RUE    Labs: Basic Metabolic Panel:  Recent Labs Lab 02/14/14 1505  NA 137  K 4.1  CL 101  CO2 22  GLUCOSE 235*  BUN 19  CREATININE 0.59  CALCIUM 9.1   GFR Estimated Creatinine Clearance: 164.7 ml/min (by C-G formula based on Cr of 0.59). Liver Function Tests:  Recent Labs Lab 02/14/14 1505  AST 18  ALT 23  ALKPHOS 84  BILITOT 0.3  PROT 7.2  ALBUMIN 3.7   No results found for this basename: LIPASE, AMYLASE,  in the last 168 hours No results found for this basename: AMMONIA,  in the last 168 hours Coagulation profile  Recent Labs Lab 02/14/14 1505  INR 1.00    CBC:  Recent Labs Lab 02/14/14 1505  WBC 7.4  NEUTROABS 3.9  HGB 13.8  HCT 37.8*  MCV 84.6  PLT 176   Cardiac Enzymes: No results found for this basename: CKTOTAL, CKMB, CKMBINDEX, TROPONINI,  in the last 168 hours BNP (last 3 results) No results found for this basename: PROBNP,  in the last 8760  hours CBG:  Recent Labs Lab 02/14/14 2107 02/15/14 0648  GLUCAP 202* 137*   D-Dimer: No results found for this basename: DDIMER,  in the last 72 hours Hgb A1c: No results found for this basename: HGBA1C,  in the last 72 hours Lipid Profile: No results found for this basename: CHOL, HDL, LDLCALC, TRIG, CHOLHDL, LDLDIRECT,  in the last 72 hours Thyroid function studies: No results found for this basename: TSH, T4TOTAL, FREET3, T3FREE, THYROIDAB,  in the last 72 hours Anemia work up: No results found for this basename: VITAMINB12, FOLATE, FERRITIN, TIBC, IRON, RETICCTPCT,  in the last 72 hours Sepsis Labs:  Recent Labs Lab 02/14/14 1505  WBC 7.4   Microbiology No results found for this or any previous visit (from the past 240 hour(s)).   . aspirin  300 mg Rectal Daily   Or  . aspirin  325 mg Oral Daily  . enoxaparin (LOVENOX) injection  40 mg Subcutaneous Q24H  . lisinopril  10 mg Oral Daily   And  . hydrochlorothiazide  12.5 mg Oral Daily  . insulin aspart  0-15 Units Subcutaneous TID WC  . insulin aspart  0-5 Units Subcutaneous QHS  . LORazepam  1 mg  Intravenous Once  . LORazepam  1 mg Intravenous Once  . pramipexole  0.25 mg Oral QHS  . sodium chloride  3 mL Intravenous Q12H   Continuous Infusions: . sodium chloride 1,000 mL (02/14/14 1942)     Assessment/Plan: Active Problems:   Facial droop    - plan per neuro . Possible brain stem infarct being ruled out . MRI / MRA brain and neck today , BZD to use prn is ordered. A1C,lipids pending. TTE , CUS ordered as well. PT/OT/Speech   DM2 - SSI, holding metformin around MR contrast  HTN - PO meds on hold pending eval . Hydralazine prn ordered.  HLD - not on daily statin . Checking lipids  DVT Prophylaxis - on lovenox  Dispo - pending results of tests ordered. Likely home in 1-2 days    LOS: 1 day   Philip Beasley 02/15/2014, 8:37 AM

## 2014-02-15 NOTE — Progress Notes (Signed)
Utilization Review Completed.Arcadio Cope T8/27/2015  

## 2014-02-15 NOTE — Evaluation (Signed)
Physical Therapy Evaluation Patient Details Name: Philip Beasley MRN: [id number] DOB: [birth year] Today's Date: 02/15/2014   History of Present Illness  48 y.o. male with hx of R Bell's palsy and acute fall presenting with new onset R facial and R arm weakness on Sunday, now with what looks like a L Bell's palsy. He did not receive IV t-PA. Initial CT unrevealing.  Stroke work up underway.  Clinical Impression  Pt adm due to above. Pt presents with decr independence with functional mobility secondary to deficits indicated below. Pt limited in mobility during evaluation due to "feeling off"; c/o visual disturbances with gt and was unable to perform high level balance activities (DGI) due to decr balance and safety. Pt to benefit from skilled acute PT to address deficits inidcated below and maximize functional mobility.      Follow Up Recommendations Other (comment);Outpatient PT;Supervision - Intermittent (OPPT for bells palsy/ facial droop )    Equipment Recommendations  None recommended by PT    Recommendations for Other Services OT consult     Precautions / Restrictions Precautions Precautions: None Restrictions Weight Bearing Restrictions: No      Mobility  Bed Mobility Overal bed mobility: Modified Independent             General bed mobility comments: use of handrails and HOB elevated  Transfers Overall transfer level: Needs assistance Equipment used: None Transfers: Sit to/from Stand Sit to Stand: Supervision         General transfer comment: supervision for safet;y slight sway with initial standing   Ambulation/Gait Ambulation/Gait assistance: Min guard Ambulation Distance (Feet): 50 Feet Assistive device:  (IV pole supporting Rt UE ) Gait Pattern/deviations: Step-through pattern;Decreased stride length;Wide base of support;Trunk flexed (ER bil LEs) Gait velocity: decreased Gait velocity interpretation: Below normal speed for age/gender General Gait Details:  pt c/o intermittent pain in Rt LE secondary to fall; limited mobility and balance; c/o "vision cloudiness"; unable to perform high level balance activities due to decr steadiness; IV pole supporting UE for balance   Stairs            Wheelchair Mobility    Modified Rankin (Stroke Patients Only) Modified Rankin (Stroke Patients Only) Pre-Morbid Rankin Score: No symptoms Modified Rankin: Moderately severe disability     Balance Overall balance assessment: Needs assistance;History of Falls Sitting-balance support: Feet supported;No upper extremity supported Sitting balance-Leahy Scale: Good     Standing balance support: During functional activity;Single extremity supported Standing balance-Leahy Scale: Poor Standing balance comment: Rt UE supported due to unsteadiness                              Pertinent Vitals/Pain Pain Assessment: 0-10 Pain Score: 3  Pain Location: Lt headache  Pain Descriptors / Indicators: Dull    Home Living Family/patient expects to be discharged to:: Private residence Living Arrangements: Spouse/significant other;Children Available Help at Discharge: Family;Available 24 hours/day Type of Home: House Home Access: Level entry     Home Layout: One level Home Equipment: None Additional Comments: pt has walk in shower     Prior Function Level of Independence: Independent         Comments: pt drives and is active with grandkids      Hand Dominance   Dominant Hand: Right    Extremity/Trunk Assessment   Upper Extremity Assessment: Defer to OT evaluation (Rt UE weakness noted )  Lower Extremity Assessment: RLE deficits/detail RLE Deficits / Details: grossly 4/5; c/o pain intermittently from fall     Cervical / Trunk Assessment: Normal  Communication   Communication: No difficulties  Cognition Arousal/Alertness: Awake/alert Behavior During Therapy: WFL for tasks assessed/performed Overall Cognitive Status:  Within Functional Limits for tasks assessed                      General Comments General comments (skin integrity, edema, etc.): educated on "BE FAST' for s/s of stroke and importance of early detection    Exercises        Assessment/Plan    PT Assessment Patient needs continued PT services  PT Diagnosis Abnormality of gait;Generalized weakness   PT Problem List Decreased strength;Decreased activity tolerance;Decreased balance;Decreased mobility;Pain;Impaired sensation;Decreased knowledge of use of DME  PT Treatment Interventions DME instruction;Gait training;Functional mobility training;Therapeutic activities;Therapeutic exercise;Balance training;Neuromuscular re-education;Patient/family education   PT Goals (Current goals can be found in the Care Plan section) Acute Rehab PT Goals Patient Stated Goal: to go home soon PT Goal Formulation: With patient Time For Goal Achievement: 02/19/14 Potential to Achieve Goals: Good    Frequency Min 4X/week   Barriers to discharge        Co-evaluation               End of Session Equipment Utilized During Treatment: Gait belt Activity Tolerance: Patient tolerated treatment well Patient left: in bed;with call bell/phone within reach;with bed alarm set Nurse Communication: Mobility status;Precautions         Time: 6578-4696 PT Time Calculation (min): 17 min   Charges:   PT Evaluation $Initial PT Evaluation Tier I: 1 Procedure PT Treatments $Gait Training: 8-22 mins   PT G CodesDonell Beasley, [place]  [phone] 02/15/2014, 1:36 PM

## 2014-02-16 LAB — GLUCOSE, CAPILLARY
GLUCOSE-CAPILLARY: 152 mg/dL — AB (ref 70–99)
Glucose-Capillary: 175 mg/dL — ABNORMAL HIGH (ref 70–99)

## 2014-02-16 MED ORDER — FENOFIBRATE 160 MG PO TABS
160.0000 mg | ORAL_TABLET | Freq: Every day | ORAL | Status: DC
  Filled 2014-02-16: qty 1

## 2014-02-16 MED ORDER — PRAMIPEXOLE DIHYDROCHLORIDE 0.25 MG PO TABS: 0.2500 mg | ORAL_TABLET | Freq: Every day | ORAL | Status: AC

## 2014-02-16 MED ORDER — POLYVINYL ALCOHOL 1.4 % OP SOLN
2.0000 [drp] | OPHTHALMIC | Status: DC | PRN
  Filled 2014-02-16: qty 15

## 2014-02-16 MED ORDER — ASPIRIN 81 MG PO CHEW: 81.0000 mg | CHEWABLE_TABLET | Freq: Every day | ORAL | Status: AC

## 2014-02-16 MED ORDER — METFORMIN HCL 500 MG PO TABS: ORAL_TABLET | ORAL | Status: DC

## 2014-02-16 MED ORDER — NAPHAZOLINE HCL 0.1 % OP SOLN
1.0000 [drp] | Freq: Four times a day (QID) | OPHTHALMIC | Status: DC | PRN
  Filled 2014-02-16 (×2): qty 15

## 2014-02-16 MED ORDER — METHYLPREDNISOLONE 4 MG PO KIT
4.0000 mg | PACK | ORAL | Status: AC
  Administered 2014-02-16: 4 mg via ORAL

## 2014-02-16 MED ORDER — METHYLPREDNISOLONE 4 MG PO KIT
8.0000 mg | PACK | Freq: Every morning | ORAL | Status: AC
  Administered 2014-02-16: 8 mg via ORAL
  Filled 2014-02-16: qty 21

## 2014-02-16 MED ORDER — ATORVASTATIN CALCIUM 20 MG PO TABS: 20.0000 mg | ORAL_TABLET | Freq: Every day | ORAL | Status: AC

## 2014-02-16 MED ORDER — DEXTROSE 5 % IV SOLN
10.0000 mg/kg | Freq: Three times a day (TID) | INTRAVENOUS | Status: DC
  Administered 2014-02-16: 800 mg via INTRAVENOUS
  Filled 2014-02-16 (×3): qty 16

## 2014-02-16 MED ORDER — LACRISERT 5 MG OP INST: 5.0000 mg | VAGINAL_INSERT | Freq: Every day | OPHTHALMIC | Status: AC

## 2014-02-16 MED ORDER — METHYLPREDNISOLONE (PAK) 4 MG PO TABS: ORAL_TABLET | ORAL | Status: DC

## 2014-02-16 MED ORDER — METHYLPREDNISOLONE 4 MG PO KIT: 4.0000 mg | PACK | Freq: Three times a day (TID) | ORAL | Status: DC

## 2014-02-16 MED ORDER — ASPIRIN 81 MG PO CHEW
81.0000 mg | CHEWABLE_TABLET | Freq: Every day | ORAL | Status: DC
  Administered 2014-02-16: 81 mg via ORAL
  Filled 2014-02-16 (×2): qty 1

## 2014-02-16 MED ORDER — METHYLPREDNISOLONE 4 MG PO KIT: 4.0000 mg | PACK | ORAL | Status: DC

## 2014-02-16 MED ORDER — ACETAMINOPHEN 500 MG PO TABS
1000.0000 mg | ORAL_TABLET | Freq: Three times a day (TID) | ORAL | Status: DC | PRN
Start: 2014-02-16 — End: 2014-02-16
  Administered 2014-02-16: 1000 mg via ORAL
  Filled 2014-02-16: qty 2

## 2014-02-16 MED ORDER — METHYLPREDNISOLONE 4 MG PO KIT: 4.0000 mg | PACK | Freq: Four times a day (QID) | ORAL | Status: DC

## 2014-02-16 MED ORDER — METHYLPREDNISOLONE 4 MG PO KIT: 8.0000 mg | PACK | Freq: Every evening | ORAL | Status: DC

## 2014-02-16 MED ORDER — ACYCLOVIR 400 MG PO TABS
800.0000 mg | ORAL_TABLET | Freq: Three times a day (TID) | ORAL | Status: AC
Start: 2014-02-16 — End: ?

## 2014-02-16 MED ORDER — ARTIFICIAL TEARS OP OINT
TOPICAL_OINTMENT | Freq: Every evening | OPHTHALMIC | Status: DC | PRN
  Filled 2014-02-16: qty 3.5

## 2014-02-16 MED ORDER — ASPIRIN 81 MG PO CHEW: 81.0000 mg | CHEWABLE_TABLET | Freq: Once | ORAL | Status: DC

## 2014-02-16 MED ORDER — ATORVASTATIN CALCIUM 10 MG PO TABS: 20.0000 mg | ORAL_TABLET | Freq: Every day | ORAL | Status: DC

## 2014-02-16 NOTE — Care Management Note (Signed)
    Page 1 of 1   02/16/2014     2:42:38 PM CARE MANAGEMENT NOTE 02/16/2014  Patient:  Philip Beasley, Philip Beasley   Account Number:  [id number]  Date Initiated:  02/16/2014  Documentation initiated by:  Lorne Skeens  Subjective/Objective Assessment:   Patient was admitted with stroke symptoms, bells palsy.     Action/Plan:   Will follow for discharge needs pending PT/OT evals and physician orders.   Anticipated DC Date:  02/16/2014   Anticipated DC Plan:  [place]  CM consult      Choice offered to / List presented to:             Status of service:  Completed, signed off Medicare Important Message given?   (If response is "NO", the following Medicare IM given date fields will be blank) Date Medicare IM given:   Medicare IM given by:   Date Additional Medicare IM given:   Additional Medicare IM given by:    Discharge Disposition:    Per UR Regulation:    If discussed at Long Length of Stay Meetings, dates discussed:    Comments:  02/16/14 [place] RN, MSN, CM- Met with patient to discuss outpatient PT/OT. Patient does not feel that he is interested at this time. He would like to see if he has any symptom improvement over the next few days prior to deciding.  CM provided the patient with information in the event that he changes his mind.

## 2014-02-16 NOTE — Progress Notes (Signed)
STROKE TEAM PROGRESS NOTE   HISTORY Philip Beasley is a 48 y.o. male with a history of symptoms that started on Sunday 02/11/2014, time unknown. He states that his been similar symptoms started with the exception that he thinks his right face might have been involved at the very initial stages, but subsequently it became his left face as well as his right arm that were weak. This has now been persistent. He also describes dysphagia. Of note, he had a fall back in July and following this had right leg weakness, but spinal MRI did not show clear injury to explain it.  Patient was not administered TPA secondary to delay in arrival. He was admitted for further evaluation and treatment.   SUBJECTIVE (INTERVAL HISTORY) Wife at bedside.right sided numbness has resolved and left face weakness persists.   OBJECTIVE Temp:  [97.9 F (36.6 C)-98.2 F (36.8 C)] 98.2 F (36.8 C) (08/28 0515) Pulse Rate:  [60-88] 60 (08/28 0515) Cardiac Rhythm:  [-] Sinus bradycardia;Heart block (08/27 2000) Resp:  [18-20] 20 (08/28 0515) BP: (114-137)/(72-88) 130/72 mmHg (08/28 0515) SpO2:  [95 %-97 %] 96 % (08/28 0515)   Recent Labs Lab 02/15/14 0648 02/15/14 1140 02/15/14 1628 02/15/14 2222 02/16/14 0635  GLUCAP 137* 137* 201* 151* 175*    Recent Labs Lab 02/14/14 1505 02/15/14 0730  NA 137 141  K 4.1 4.2  CL 101 104  CO2 22 24  GLUCOSE 235* 145*  BUN 19 13  CREATININE 0.59 0.58  CALCIUM 9.1 8.5    Recent Labs Lab 02/14/14 1505  AST 18  ALT 23  ALKPHOS 84  BILITOT 0.3  PROT 7.2  ALBUMIN 3.7    Recent Labs Lab 02/14/14 1505 02/15/14 0730  WBC 7.4 6.6  NEUTROABS 3.9  --   HGB 13.8 13.4  HCT 37.8* 38.4*  MCV 84.6 87.1  PLT 176 172   No results found for this basename: CKTOTAL, CKMB, CKMBINDEX, TROPONINI,  in the last 168 hours  Recent Labs  02/14/14 1505  LABPROT 13.2  INR 1.00    Recent Labs  02/14/14 1908  COLORURINE YELLOW  LABSPEC 1.026  PHURINE 5.5  GLUCOSEU  >1000*  HGBUR NEGATIVE  BILIRUBINUR NEGATIVE  KETONESUR NEGATIVE  PROTEINUR NEGATIVE  UROBILINOGEN 0.2  NITRITE NEGATIVE  LEUKOCYTESUR NEGATIVE       Component Value Date/Time   CHOL 200 02/15/2014 0500   Lab Results  Component Value Date   HGBA1C 7.8* 02/15/2014      Component Value Date/Time   LABOPIA NONE DETECTED 02/14/2014 1908   COCAINSCRNUR NONE DETECTED 02/14/2014 1908   LABBENZ NONE DETECTED 02/14/2014 1908   AMPHETMU NONE DETECTED 02/14/2014 1908   THCU NONE DETECTED 02/14/2014 1908   LABBARB NONE DETECTED 02/14/2014 1908    No results found for this basename: ETH,  in the last 168 hours  Mr Maxine Glenn Head Wo Contrast  02/15/2014   CLINICAL DATA:  Left facial and right arm weakness  EXAM: MRI HEAD WITHOUT AND WITH CONTRAST  MRA HEAD WITHOUT CONTRAST  MRA NECK WITHOUT AND WITH CONTRAST  TECHNIQUE: Multiplanar, multiecho pulse sequences of the brain and surrounding structures were obtained without and with intravenous contrast. Angiographic images of the Circle of Willis were obtained using MRA technique without intravenous contrast. Angiographic images of the neck were obtained using MRA technique without and with intravenous contrast. Carotid stenosis measurements (when applicable) are obtained utilizing NASCET criteria, using the distal internal carotid diameter as the denominator.  CONTRAST:  20mL MULTIHANCE GADOBENATE  DIMEGLUMINE 529 MG/ML IV SOLN  COMPARISON:  CT head 02/14/2014  FINDINGS: MRI HEAD FINDINGS  Ventricle size is normal. Cerebral volume is normal. Relatively small pituitary gland without focal lesion.  Negative for acute infarct. No significant chronic ischemia. Cerebral white matter is normal.  Negative for hemorrhage or mass.  Postcontrast imaging is degraded by motion however, no enhancing lesion is identified.  MRA HEAD FINDINGS  Both vertebral arteries are patent to the basilar. PICA is patent bilaterally. The basilar is widely patent. Superior cerebellar and posterior  cerebral arteries are normal  Internal carotid artery widely patent bilaterally. Anterior and middle cerebral arteries appear normal bilaterally  Negative for cerebral aneurysm.  MRA NECK FINDINGS  Left common carotid artery origin from the innominate artery. Proximal great vessels widely patent  Both vertebral arteries are equal in size and widely patent without stenosis  Carotid artery widely patent bilaterally without stenosis.  IMPRESSION: Negative MRI of the head.  No acute infarct or mass.  Negative MRA of the head and neck   Electronically Signed   By: Marlan Palau M.D.   On: 02/15/2014 14:49   Mr Angiogram Neck W Wo Contrast  02/15/2014   CLINICAL DATA:  Left facial and right arm weakness  EXAM: MRI HEAD WITHOUT AND WITH CONTRAST  MRA HEAD WITHOUT CONTRAST  MRA NECK WITHOUT AND WITH CONTRAST  TECHNIQUE: Multiplanar, multiecho pulse sequences of the brain and surrounding structures were obtained without and with intravenous contrast. Angiographic images of the Circle of Willis were obtained using MRA technique without intravenous contrast. Angiographic images of the neck were obtained using MRA technique without and with intravenous contrast. Carotid stenosis measurements (when applicable) are obtained utilizing NASCET criteria, using the distal internal carotid diameter as the denominator.  CONTRAST:  20mL MULTIHANCE GADOBENATE DIMEGLUMINE 529 MG/ML IV SOLN  COMPARISON:  CT head 02/14/2014  FINDINGS: MRI HEAD FINDINGS  Ventricle size is normal. Cerebral volume is normal. Relatively small pituitary gland without focal lesion.  Negative for acute infarct. No significant chronic ischemia. Cerebral white matter is normal.  Negative for hemorrhage or mass.  Postcontrast imaging is degraded by motion however, no enhancing lesion is identified.  MRA HEAD FINDINGS  Both vertebral arteries are patent to the basilar. PICA is patent bilaterally. The basilar is widely patent. Superior cerebellar and posterior  cerebral arteries are normal  Internal carotid artery widely patent bilaterally. Anterior and middle cerebral arteries appear normal bilaterally  Negative for cerebral aneurysm.  MRA NECK FINDINGS  Left common carotid artery origin from the innominate artery. Proximal great vessels widely patent  Both vertebral arteries are equal in size and widely patent without stenosis  Carotid artery widely patent bilaterally without stenosis.  IMPRESSION: Negative MRI of the head.  No acute infarct or mass.  Negative MRA of the head and neck   Electronically Signed   By: Marlan Palau M.D.   On: 02/15/2014 14:49   Mr Laqueta Jean VQ Contrast  02/15/2014   CLINICAL DATA:  Left facial and right arm weakness  EXAM: MRI HEAD WITHOUT AND WITH CONTRAST  MRA HEAD WITHOUT CONTRAST  MRA NECK WITHOUT AND WITH CONTRAST  TECHNIQUE: Multiplanar, multiecho pulse sequences of the brain and surrounding structures were obtained without and with intravenous contrast. Angiographic images of the Circle of Willis were obtained using MRA technique without intravenous contrast. Angiographic images of the neck were obtained using MRA technique without and with intravenous contrast. Carotid stenosis measurements (when applicable) are obtained utilizing NASCET criteria,  using the distal internal carotid diameter as the denominator.  CONTRAST:  20mL MULTIHANCE GADOBENATE DIMEGLUMINE 529 MG/ML IV SOLN  COMPARISON:  CT head 02/14/2014  FINDINGS: MRI HEAD FINDINGS  Ventricle size is normal. Cerebral volume is normal. Relatively small pituitary gland without focal lesion.  Negative for acute infarct. No significant chronic ischemia. Cerebral white matter is normal.  Negative for hemorrhage or mass.  Postcontrast imaging is degraded by motion however, no enhancing lesion is identified.  MRA HEAD FINDINGS  Both vertebral arteries are patent to the basilar. PICA is patent bilaterally. The basilar is widely patent. Superior cerebellar and posterior cerebral  arteries are normal  Internal carotid artery widely patent bilaterally. Anterior and middle cerebral arteries appear normal bilaterally  Negative for cerebral aneurysm.  MRA NECK FINDINGS  Left common carotid artery origin from the innominate artery. Proximal great vessels widely patent  Both vertebral arteries are equal in size and widely patent without stenosis  Carotid artery widely patent bilaterally without stenosis.  IMPRESSION: Negative MRI of the head.  No acute infarct or mass.  Negative MRA of the head and neck   Electronically Signed   By: Marlan Palau M.D.   On: 02/15/2014 14:49     PHYSICAL EXAM Pleasant middle-age Caucasian male currently not in distress.Awake alert. Afebrile. Head is nontraumatic. Neck is supple without bruit. Hearing is normal. Cardiac exam no murmur or gallop. Lungs are clear to auscultation. Distal pulses are well felt. Neurological Exam : Awake alert oriented x3 with normal speech and language function. No dysarthria, aphasia or apraxia. Fundi are not visualized. Extraocular moments are full range without nystagmus. Visual acuity seems normal. Mild left facial weakness including eye closure and frowning as well as cheek. Bell's phenomenon is absent. No objective sensory loss on the face or extremities. No focal weakness. Coordination is normal. Gait was not tested. ASSESSMENT/PLAN  Philip Beasley is a 48 y.o. male with hx of R Bell's palsy and acute fall presenting with new onset R facial and R arm weakness on Sunday, now with what looks like a L Bell's palsy. He did not receive IV t-PA. Initial CT unrevealing.  Stroke work up underway.  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   no antithrombotics prior to admission, now on aspirin 325 mg orally every day  MRI negative  MRA head negative  MRA neck given fall, ? Dissection, pain negative  2D Echo  No source of embolus   Lovenox 40 mg sq daily for  VTE prophylaxis  Carb Control thin liquids.   Resultant left facial weakness  Therapy needs:  OP PT and OT  Consider OP obstructive sleep apnea eval given body habitus as stroke risk factor  Risk factor management/education  Patient counseled to be compliant with his antithrombotic medications  Artificial tears to eyes throughout the day  lacrilube at night  Start acyclovir /kg tid x 10 day and medrol dose pak (ordered)  Disposition:  Home with wife  Hypertension   Home meds:  Lisinopril, HCTZ. Resumed in hospital  SBP goal < 220 from stroke standpoint  Stable  Hyperlipidemia  LDL high, unable to calculate  Statin recommended  Diabetes  HgbA1c 7.8  uncontrolled  Home Meds: glucophage  Goal < 7.0  Other Stroke Risk Factors   Obesity, Body mass index is 39.66 kg/(m^2).    Family hx stroke (mother)  Other Pertinent History  Hx R Bell's palsy  Acute  fall, hit head  Hospital day # 2  Annie Main, MSN, RN, ANVP-BC, ANP-BC, [facility] Pager: [phone] 02/16/2014 9:51 AM   I have personally examined this patient, reviewed notes, independently viewed imaging studies, participated in medical decision making and plan of care. I have made any additions or clarifications directly to the above note. Agree with note above.  Delia Heady, MD Medical Director [id number] [facility] Pager: [phone] 02/16/2014 11:27 PM   Note: This document was prepared with digital dictation and possible smart phrase technology. Any transcriptional errors that result from this process are unintentional    To contact Stroke Continuity provider, please refer to [email or web address]. After hours, contact General Neurology

## 2014-02-16 NOTE — Progress Notes (Signed)
ANTIVIRAL CONSULT NOTE - INITIAL  Pharmacy Consult for Acyclovir Indication: L Bell's Palsy  No Known Allergies  Patient Measurements: Height:  (188 cm) Weight: 309 lb (140.161 kg) IBW/kg (Calculated) : 82.2   Vital Signs: Temp: 98.7 F (37.1 C) (08/28 0945) Temp src: Oral (08/28 0945) BP: 120/75 mmHg (08/28 0945) Pulse Rate: 67 (08/28 0945) Intake/Output from previous day: 08/27 0701 - 08/28 0700 In: 2650 [P.O.:720; I.V.:1930] Out: -  Intake/Output from this shift: Total I/O In: 360 [P.O.:360] Out: -   Labs:  Recent Labs  02/14/14 1505 02/15/14 0730  WBC 7.4 6.6  HGB 13.8 13.4  PLT 176 172  CREATININE 0.59 0.58   Estimated Creatinine Clearance: 164.7 ml/min (by C-G formula based on Cr of 0.58). No results found for this basename: VANCOTROUGH, VANCOPEAK, VANCORANDOM, GENTTROUGH, GENTPEAK, GENTRANDOM, TOBRATROUGH, TOBRAPEAK, TOBRARND, AMIKACINPEAK, AMIKACINTROU, AMIKACIN,  in the last 72 hours   Microbiology: No results found for this or any previous visit (from the past 720 hour(s)).  Medical History: Past Medical History  Diagnosis Date  . Hypertension   . Diabetes mellitus without complication     Medications:  Prescriptions prior to admission  Medication Sig Dispense Refill  . acetaminophen (TYLENOL) 500 MG tablet Take 500 mg by mouth every 6 (six) hours as needed for moderate pain.      Marland Kitchen aspirin-acetaminophen-caffeine (EXCEDRIN MIGRAINE) 250-250-65 MG per tablet Take 1 tablet by mouth every 6 (six) hours as needed for headache.      . diphenhydrAMINE (BENADRYL) 25 MG tablet Take 25 mg by mouth every 6 (six) hours as needed for itching or allergies.      Marland Kitchen lisinopril-hydrochlorothiazide (PRINZIDE,ZESTORETIC) 10-12.5 MG per tablet Take 1 tablet by mouth daily.      . metFORMIN (GLUCOPHAGE) 500 MG tablet Take 500 mg by mouth 2 (two) times daily with a meal.      . methocarbamol (ROBAXIN) 500 MG tablet Take 500 mg by mouth at bedtime as needed for  muscle spasms.      . naproxen (NAPROSYN) 500 MG tablet Take 500 mg by mouth at bedtime as needed for moderate pain.      Marland Kitchen tiZANidine (ZANAFLEX) 4 MG tablet Take 4 mg by mouth 2 (two) times daily as needed.       Assessment: Pt is a 48yo male with a history of R Bell's palsy to start acyclovir and methylprednisolone for new partial L Bell's palsy. Will start acyclovir /kg TID for 10 days based on ideal body weight.   Goal of Therapy:  Improvement in Bell's palsy symptoms  Plan:  - Start acyclovir  IV TID  Arlean Hopping. Newman Pies, PharmD Clinical Pharmacist Pager [phone] 02/16/2014,11:56 AM

## 2014-02-16 NOTE — Progress Notes (Signed)
Discharge instruction reviewed with patient and family. All questions answered at this time. RX given to patient. Awaiting on transportation.  Sim Boast, RN

## 2014-02-16 NOTE — Discharge Summary (Signed)
Physician Discharge Summary    Philip Beasley  MR#: [id number]  DOB:12-23-1965  Date of Admission: 02/14/2014 Date of Discharge: 02/16/2014  Attending Physician:Aydyn Testerman  Patient's NWG:[id number], Lorin Picket, MD  Consults:Treatment Team:  Md Stroke, MD    Discharge Diagnoses: Active Problems:   Facial droop   TIA (transient ischemic attack)   Facial nerve palsy   Discharge Medications:   Medication List         acetaminophen 500 MG tablet  Commonly known as:  TYLENOL  Take 500 mg by mouth every 6 (six) hours as needed for moderate pain.     acyclovir 400 MG tablet  Commonly known as:  ZOVIRAX  Take 2 tablets (800 mg total) by mouth 3 (three) times daily.     aspirin 81 MG chewable tablet  Chew 1 tablet (81 mg total) by mouth daily.     aspirin-acetaminophen-caffeine 250-250-65 MG per tablet  Commonly known as:  EXCEDRIN MIGRAINE  Take 1 tablet by mouth every 6 (six) hours as needed for headache.     atorvastatin 20 MG tablet  Commonly known as:  LIPITOR  Take 1 tablet (20 mg total) by mouth daily at 6 PM.     diphenhydrAMINE 25 MG tablet  Commonly known as:  BENADRYL  Take 25 mg by mouth every 6 (six) hours as needed for itching or allergies.     hydroxypropyl cellulose 5 MG Inst  Place 5 mg into the left eye daily.     lisinopril-hydrochlorothiazide 10-12.5 MG per tablet  Commonly known as:  PRINZIDE,ZESTORETIC  Take 1 tablet by mouth daily.     metFORMIN 500 MG tablet  Commonly known as:  GLUCOPHAGE  Take 2 tabs (1,000mg ) by mouth twice daily     methocarbamol 500 MG tablet  Commonly known as:  ROBAXIN  Take 500 mg by mouth at bedtime as needed for muscle spasms.     methylPREDNIsolone 4 MG tablet  Commonly known as:  MEDROL DOSPACK  follow package directions     naproxen 500 MG tablet  Commonly known as:  NAPROSYN  Take 500 mg by mouth at bedtime as needed for moderate pain.     pramipexole 0.25 MG tablet  Commonly known as:  MIRAPEX  Take 1  tablet (0.25 mg total) by mouth at bedtime.     tiZANidine 4 MG tablet  Commonly known as:  ZANAFLEX  Take 4 mg by mouth 2 (two) times daily as needed.        Hospital Procedures: Dg Chest 2 View  02/14/2014   CLINICAL DATA:  Altered mental status.  EXAM: CHEST  2 VIEW  COMPARISON:  Single view of the chest 02/09/2006 CT chest 07/01/2004.  FINDINGS: Lung volumes are low but the lungs are clear. Heart size is normal. No pneumothorax or pleural effusion.  IMPRESSION: No acute finding in a low volume chest.   Electronically Signed   By: Drusilla Kanner M.D.   On: 02/14/2014 18:39   Dg Lumbar Spine Complete  01/18/2014   CLINICAL DATA:  Fall, back pain  EXAM: LUMBAR SPINE - COMPLETE 4+ VIEW  COMPARISON:  None.  FINDINGS: Five non rib-bearing lumbar type vertebral bodies are present. Vertebral bodies are normally aligned with preservation of the normal lumbar lordosis. There is mild anterior wedging of the T11, T12, and L1 vertebral bodies, favored to be chronic in nature. No definite acute fracture identified. There is no listhesis.  Multilevel degenerative disc disease is evidenced by intervertebral disc space narrowing,  endplate sclerosis, and osteophytosis is seen, most prevalent at T12-L1 and L3-4. Multilevel facet arthropathy present within the lower lumbar spine.  No paraspinous soft tissue abnormality.  IMPRESSION: 1. Mild anterior wedging deformity of the T11, T12, and L1 vertebral bodies, favored to be chronic in nature. Correlation with physical exam and site of pain is recommended. In addition, further evaluation with CT could be performed if findings remain equivocal. 2. Moderate multilevel degenerative disc disease as above.   Electronically Signed   By: Rise Mu M.D.   On: 01/18/2014 03:57   Ct Head (brain) Wo Contrast  02/14/2014   CLINICAL DATA:  Numbness and facial droop  EXAM: CT HEAD WITHOUT CONTRAST  TECHNIQUE: Contiguous axial images were obtained from the base of the  skull through the vertex without intravenous contrast.  COMPARISON:  Noncontrast CT scan of the brain of January 18, 2014  FINDINGS: The ventricles are normal in size and position. There is no intracranial hemorrhage nor intracranial mass effect. No acute ischemic change is demonstrated. There are mild deep white matter hypodensities in both cerebral hemispheres. The cerebellum and brainstem are normal.  The observed paranasal sinuses and mastoid air cells are clear. There is no acute skull fracture nor lytic or blastic bony lesion. New caps the internal auditory canals exhibit normal caliber.  IMPRESSION: 1. There is no acute intracranial hemorrhage nor evidence of acute ischemic change or mass effect. 2. There are white matter hypodensities consistent with chronic small vessel ischemia. 3. There is no acute skull fracture.   Electronically Signed   By: David  [place]   On: 02/14/2014 16:35   Ct Head Wo Contrast  01/18/2014   CLINICAL DATA:  Larey Seat yesterday. Hit back of head. Right leg weakness and numbness. Diabetic. Hypertension.  EXAM: CT HEAD WITHOUT CONTRAST  TECHNIQUE: Contiguous axial images were obtained from the base of the skull through the vertex without intravenous contrast.  COMPARISON:  11/04/2005.  FINDINGS: No skull fracture or intracranial hemorrhage.  No CT evidence of large acute infarct.  No hydrocephalus.  No intracranial mass lesion noted on this unenhanced exam.  Mastoid air cells, middle ear cavities and visualized paranasal sinuses are clear.  Orbital structures unremarkable.  IMPRESSION: No acute abnormality.  Please see above.   Electronically Signed   By: Bridgett Larsson M.D.   On: 01/18/2014 10:33   Ct Lumbar Spine Wo Contrast  01/18/2014   CLINICAL DATA:  Fall, back pain.  EXAM: CT LUMBAR SPINE WITHOUT CONTRAST  TECHNIQUE: Multidetector CT imaging of the lumbar spine was performed without intravenous contrast administration. Multiplanar CT image reconstructions were also generated.   COMPARISON:  Prior radiograph performed earlier on the same day.  FINDINGS: For the purposes of this dictation, the lowest well-formed intervertebral disc spaces presumed to be the L5-S1 level, and there presumed to be 5 lumbar type vertebral bodies.  Vertebral bodies are normally aligned with preservation of the normal lumbar lordosis. Previously seen mild anterior wedging of the T11, T12, and L1 vertebral bodies is again seen, which appears chronic on this study. No cortical irregularity to suggest acute fracture identified. Vertebral body heights are otherwise preserved.  Prominent bridging anterior osteophytes seen within the lower thoracic and upper lumbar spine anteriorly. Degenerative intervertebral disc space narrowing present at multiple levels. There is degenerative vacuum disc phenomena at L3-4, L4-5, and L5-S1. Degenerative disc bulging present at L4-5 and L5-S1.  No paraspinous hematoma or other abnormality.  SI joints are approximated.  IMPRESSION: 1. No  CT evidence of acute traumatic injury within the lumbar spine. Previously seen mild anterior wedging deformity of the T11, T12, and L1 vertebral bodies appears chronic in nature. 2. Moderate multilevel degenerative disc disease throughout the lumbar spine.   Electronically Signed   By: Rise Mu M.D.   On: 01/18/2014 04:49   Mr Maxine Glenn Head Wo Contrast  02/15/2014   CLINICAL DATA:  Left facial and right arm weakness  EXAM: MRI HEAD WITHOUT AND WITH CONTRAST  MRA HEAD WITHOUT CONTRAST  MRA NECK WITHOUT AND WITH CONTRAST  TECHNIQUE: Multiplanar, multiecho pulse sequences of the brain and surrounding structures were obtained without and with intravenous contrast. Angiographic images of the Circle of Willis were obtained using MRA technique without intravenous contrast. Angiographic images of the neck were obtained using MRA technique without and with intravenous contrast. Carotid stenosis measurements (when applicable) are obtained utilizing  NASCET criteria, using the distal internal carotid diameter as the denominator.  CONTRAST:  20mL MULTIHANCE GADOBENATE DIMEGLUMINE 529 MG/ML IV SOLN  COMPARISON:  CT head 02/14/2014  FINDINGS: MRI HEAD FINDINGS  Ventricle size is normal. Cerebral volume is normal. Relatively small pituitary gland without focal lesion.  Negative for acute infarct. No significant chronic ischemia. Cerebral white matter is normal.  Negative for hemorrhage or mass.  Postcontrast imaging is degraded by motion however, no enhancing lesion is identified.  MRA HEAD FINDINGS  Both vertebral arteries are patent to the basilar. PICA is patent bilaterally. The basilar is widely patent. Superior cerebellar and posterior cerebral arteries are normal  Internal carotid artery widely patent bilaterally. Anterior and middle cerebral arteries appear normal bilaterally  Negative for cerebral aneurysm.  MRA NECK FINDINGS  Left common carotid artery origin from the innominate artery. Proximal great vessels widely patent  Both vertebral arteries are equal in size and widely patent without stenosis  Carotid artery widely patent bilaterally without stenosis.  IMPRESSION: Negative MRI of the head.  No acute infarct or mass.  Negative MRA of the head and neck   Electronically Signed   By: Marlan Palau M.D.   On: 02/15/2014 14:49   Mr Angiogram Neck W Wo Contrast  02/15/2014   CLINICAL DATA:  Left facial and right arm weakness  EXAM: MRI HEAD WITHOUT AND WITH CONTRAST  MRA HEAD WITHOUT CONTRAST  MRA NECK WITHOUT AND WITH CONTRAST  TECHNIQUE: Multiplanar, multiecho pulse sequences of the brain and surrounding structures were obtained without and with intravenous contrast. Angiographic images of the Circle of Willis were obtained using MRA technique without intravenous contrast. Angiographic images of the neck were obtained using MRA technique without and with intravenous contrast. Carotid stenosis measurements (when applicable) are obtained utilizing  NASCET criteria, using the distal internal carotid diameter as the denominator.  CONTRAST:  20mL MULTIHANCE GADOBENATE DIMEGLUMINE 529 MG/ML IV SOLN  COMPARISON:  CT head 02/14/2014  FINDINGS: MRI HEAD FINDINGS  Ventricle size is normal. Cerebral volume is normal. Relatively small pituitary gland without focal lesion.  Negative for acute infarct. No significant chronic ischemia. Cerebral white matter is normal.  Negative for hemorrhage or mass.  Postcontrast imaging is degraded by motion however, no enhancing lesion is identified.  MRA HEAD FINDINGS  Both vertebral arteries are patent to the basilar. PICA is patent bilaterally. The basilar is widely patent. Superior cerebellar and posterior cerebral arteries are normal  Internal carotid artery widely patent bilaterally. Anterior and middle cerebral arteries appear normal bilaterally  Negative for cerebral aneurysm.  MRA NECK FINDINGS  Left common carotid  artery origin from the innominate artery. Proximal great vessels widely patent  Both vertebral arteries are equal in size and widely patent without stenosis  Carotid artery widely patent bilaterally without stenosis.  IMPRESSION: Negative MRI of the head.  No acute infarct or mass.  Negative MRA of the head and neck   Electronically Signed   By: Marlan Palau M.D.   On: 02/15/2014 14:49   Mr Laqueta Jean ZO Contrast  02/15/2014   CLINICAL DATA:  Left facial and right arm weakness  EXAM: MRI HEAD WITHOUT AND WITH CONTRAST  MRA HEAD WITHOUT CONTRAST  MRA NECK WITHOUT AND WITH CONTRAST  TECHNIQUE: Multiplanar, multiecho pulse sequences of the brain and surrounding structures were obtained without and with intravenous contrast. Angiographic images of the Circle of Willis were obtained using MRA technique without intravenous contrast. Angiographic images of the neck were obtained using MRA technique without and with intravenous contrast. Carotid stenosis measurements (when applicable) are obtained utilizing NASCET  criteria, using the distal internal carotid diameter as the denominator.  CONTRAST:  20mL MULTIHANCE GADOBENATE DIMEGLUMINE 529 MG/ML IV SOLN  COMPARISON:  CT head 02/14/2014  FINDINGS: MRI HEAD FINDINGS  Ventricle size is normal. Cerebral volume is normal. Relatively small pituitary gland without focal lesion.  Negative for acute infarct. No significant chronic ischemia. Cerebral white matter is normal.  Negative for hemorrhage or mass.  Postcontrast imaging is degraded by motion however, no enhancing lesion is identified.  MRA HEAD FINDINGS  Both vertebral arteries are patent to the basilar. PICA is patent bilaterally. The basilar is widely patent. Superior cerebellar and posterior cerebral arteries are normal  Internal carotid artery widely patent bilaterally. Anterior and middle cerebral arteries appear normal bilaterally  Negative for cerebral aneurysm.  MRA NECK FINDINGS  Left common carotid artery origin from the innominate artery. Proximal great vessels widely patent  Both vertebral arteries are equal in size and widely patent without stenosis  Carotid artery widely patent bilaterally without stenosis.  IMPRESSION: Negative MRI of the head.  No acute infarct or mass.  Negative MRA of the head and neck   Electronically Signed   By: Marlan Palau M.D.   On: 02/15/2014 14:49   Mr Thoracic Spine Wo Contrast  01/18/2014   CLINICAL DATA:  Back pain after a fall.  EXAM: MRI THORACIC AND LUMBAR SPINE WITHOUT CONTRAST  TECHNIQUE: Multiplanar and multiecho pulse sequences of the thoracic and lumbar spine were obtained without intravenous contrast.  COMPARISON:  None.  FINDINGS: MR THORACIC SPINE FINDINGS  No acute fracture is identified. Scattered Schmorl's nodes are noted, most prominent in the anterior, superior endplate of T4. Hemangioma is seen in T9 and there is some scattered degenerative endplate signal change in the lower thoracic spine. No worrisome marrow lesion is identified. Thoracic kyphosis appears  exaggerated. Multilevel facet arthropathy is noted. The thoracic cord demonstrates normal signal. Imaged paraspinous structures are unremarkable.  T3-4:  Mild disc bulge without central canal or foraminal narrowing.  T4-5: Small left paracentral protrusion without central canal or foraminal narrowing.  T5-6: Small left paracentral protrusion without central canal or foraminal narrowing.  T6-7: Minimal disc bulge.  The central canal and foramina are open.  T7-8: Negative.  T8-9:  Negative.  T9-10: Tiny right paracentral protrusion without central canal or foraminal narrowing.  T10-11:  Negative.  T11-12: Negative.  T12-L1:  Negative.  MR LUMBAR SPINE FINDINGS  Vertebral body height and alignment are maintained. Scattered, mild degenerative endplate signal change is noted but no  worrisome marrow lesion is identified. The thoracic cord demonstrates normal signal. Imaged intra-abdominal contents are unremarkable.  L1-2: Minimal disc bulge. The central spinal canal neural foramina are widely patent.  L2-3: Minimal disc bulge without central canal or foraminal stenosis.  L3-4: Minimal disc bulge without central canal or foraminal stenosis.  L4-5: Shallow disc bulge without central canal or foraminal narrowing.  L5-S1: Shallow disc bulge endplate spur identified. There is some narrowing in the lateral recesses, more notable on the left. There is also moderate left foraminal narrowing. The right foramen is open.  IMPRESSION: MR THORACIC SPINE IMPRESSION  Negative for fracture or other acute abnormality in the thoracic spine.  Overall mild thoracic degenerative change without central canal or foraminal stenosis.  MR LUMBAR SPINE IMPRESSION  Negative for fracture or other acute abnormality in the lumbar spine.  Overall mild multilevel degenerative change most notable at L5-S1 as detailed above.   Electronically Signed   By: Drusilla Kanner M.D.   On: 01/18/2014 15:44   Mr Lumbar Spine Wo Contrast  01/18/2014   CLINICAL  DATA:  Back pain after a fall.  EXAM: MRI THORACIC AND LUMBAR SPINE WITHOUT CONTRAST  TECHNIQUE: Multiplanar and multiecho pulse sequences of the thoracic and lumbar spine were obtained without intravenous contrast.  COMPARISON:  None.  FINDINGS: MR THORACIC SPINE FINDINGS  No acute fracture is identified. Scattered Schmorl's nodes are noted, most prominent in the anterior, superior endplate of T4. Hemangioma is seen in T9 and there is some scattered degenerative endplate signal change in the lower thoracic spine. No worrisome marrow lesion is identified. Thoracic kyphosis appears exaggerated. Multilevel facet arthropathy is noted. The thoracic cord demonstrates normal signal. Imaged paraspinous structures are unremarkable.  T3-4:  Mild disc bulge without central canal or foraminal narrowing.  T4-5: Small left paracentral protrusion without central canal or foraminal narrowing.  T5-6: Small left paracentral protrusion without central canal or foraminal narrowing.  T6-7: Minimal disc bulge.  The central canal and foramina are open.  T7-8: Negative.  T8-9:  Negative.  T9-10: Tiny right paracentral protrusion without central canal or foraminal narrowing.  T10-11:  Negative.  T11-12: Negative.  T12-L1:  Negative.  MR LUMBAR SPINE FINDINGS  Vertebral body height and alignment are maintained. Scattered, mild degenerative endplate signal change is noted but no worrisome marrow lesion is identified. The thoracic cord demonstrates normal signal. Imaged intra-abdominal contents are unremarkable.  L1-2: Minimal disc bulge. The central spinal canal neural foramina are widely patent.  L2-3: Minimal disc bulge without central canal or foraminal stenosis.  L3-4: Minimal disc bulge without central canal or foraminal stenosis.  L4-5: Shallow disc bulge without central canal or foraminal narrowing.  L5-S1: Shallow disc bulge endplate spur identified. There is some narrowing in the lateral recesses, more notable on the left. There is  also moderate left foraminal narrowing. The right foramen is open.  IMPRESSION: MR THORACIC SPINE IMPRESSION  Negative for fracture or other acute abnormality in the thoracic spine.  Overall mild thoracic degenerative change without central canal or foraminal stenosis.  MR LUMBAR SPINE IMPRESSION  Negative for fracture or other acute abnormality in the lumbar spine.  Overall mild multilevel degenerative change most notable at L5-S1 as detailed above.   Electronically Signed   By: Drusilla Kanner M.D.   On: 01/18/2014 15:44    History of Present Illness: Presented w/ L sided facial droop and R sided numbness/weakness   Hospital Course: L sided Bells palsy -Acyclovir at /kg tid x 10d.  Medrol dose pack. Add eye drops for L eye. Diet resumed as normal diet per speech. Outpt OT recd . MRI/MRA head and neck r/o'd CVA   TIA - R sided weakness. MRI/MRA brain and neck neg. TTE neg. No signs of CVA. DM and Lipids uncontrolled and will treat as stated below. adding ASA 81 daily should be adequate as he was on no anticoag prior to admission .   DM2 -   A1C was 7.8. Increase metformin 500 BID to 1000 BID at discharge and f/u outpt   HTN - controlled on home meds   HLD - moderate hypertriglyceridemia w/ incalculable LDL, but likely high given discordant profile. Will start on lipitor  qhs and f/u w/ LDLP as outpatient. Had myalgia on lovastatin so starting w/ a lower dose to try and achieve tolerance   Restless legs - improved w/ requip . rx sent to pharmacy   Dispo - home w/ outpt f/u     Day of Discharge Exam BP 120/75  Pulse 67  Temp(Src) 98.7 F (37.1 C) (Oral)  Resp 20  Ht  (1.88 m)  Wt 309 lb (140.161 kg)  BMI 39.66 kg/m2  SpO2 96%  Physical Exam: General appearance: WM in NAD  HEENT: MMM, tongue and uvula at midline  Resp: CTAB, no w/r  Cardio:sinus brady, no MRG  GI: soft, non-tender; bowel sounds normal; no masses, no organomegaly  Extremities: no clubbing, cyanosis  or edema  Neuro: mouth droop and eyelid droop on L , 3/5 strength on RUE    Discharge Labs:  Recent Labs  02/14/14 1505 02/15/14 0730  NA 137 141  K 4.1 4.2  CL 101 104  CO2 22 24  GLUCOSE 235* 145*  BUN 19 13  CREATININE 0.59 0.58  CALCIUM 9.1 8.5    Recent Labs  02/14/14 1505  AST 18  ALT 23  ALKPHOS 84  BILITOT 0.3  PROT 7.2  ALBUMIN 3.7    Recent Labs  02/14/14 1505 02/15/14 0730  WBC 7.4 6.6  NEUTROABS 3.9  --   HGB 13.8 13.4  HCT 37.8* 38.4*  MCV 84.6 87.1  PLT 176 172   Lab Results  Component Value Date   INR 1.00 02/14/2014   No results found for this basename: CKTOTAL, CKMB, CKMBINDEX, TROPONINI,  in the last 72 hours No results found for this basename: TSH, T4TOTAL, FREET3, T3FREE, THYROIDAB,  in the last 72 hours No results found for this basename: VITAMINB12, FOLATE, FERRITIN, TIBC, IRON, RETICCTPCT,  in the last 72 hours  Discharge instructions:     Discharge Instructions   Diet - low sodium heart healthy    Complete by:  As directed      Increase activity slowly    Complete by:  As directed           01-Home or Self Care   Disposition: home   Follow-up Appts: Follow-up with Dr. Link Snuffer at [facility] in 1 week.  Call for appointment.  Condition on Discharge:stable   Tests Needing Follow-up: none  Time spent in discharge (includes decision making & examination of pt): 45 minutes    Signed: Sharice Harriss 02/16/2014, 1:15 PM

## 2014-02-16 NOTE — Progress Notes (Addendum)
Physician Daily Progress Note  Subjective:  no events   Objective: Vital signs in last 24 hours:   Filed Vitals:   02/15/14 1012 02/15/14 1740 02/15/14 2128 02/16/14 0515  BP: 133/74 137/81 114/88 130/72  Pulse: 69 65 88 60  Temp: 98 F (36.7 C) 97.9 F (36.6 C) 98.2 F (36.8 C) 98.2 F (36.8 C)  TempSrc: Oral Oral Oral Oral  Resp: Height:      Weight:      SpO2: 97% 95% 97% 96%   Weight change:  Last BM Date: 02/14/14  CBG (last 3)   Recent Labs  02/15/14 1628 02/15/14 2222 02/16/14 0635  GLUCAP 201* 151* 175*    Intake/Output from previous day:  Intake/Output Summary (Last 24 hours) at 02/16/14 0800 Last data filed at 02/15/14 1700  Gross per 24 hour  Intake   2650 ml  Output      0 ml  Net   2650 ml      Physical Exam General appearance: WM in NAD  HEENT: MMM, tongue and uvula at midline Resp:  CTAB, no w/r Cardio:sinus brady, no MRG  GI: soft, non-tender; bowel sounds normal; no masses,  no organomegaly Extremities: no clubbing, cyanosis or edema Neuro: mouth droop and eyelid droop on L , 3/5 strength on RUE    Labs: Basic Metabolic Panel:  Recent Labs Lab 02/14/14 1505 02/15/14 0730  NA 137 141  K 4.1 4.2  CL 101 104  CO2 22 24  GLUCOSE 235* 145*  BUN 19 13  CREATININE 0.59 0.58  CALCIUM 9.1 8.5   GFR Estimated Creatinine Clearance: 164.7 ml/min (by C-G formula based on Cr of 0.58). Liver Function Tests:  Recent Labs Lab 02/14/14 1505  AST 18  ALT 23  ALKPHOS 84  BILITOT 0.3  PROT 7.2  ALBUMIN 3.7   No results found for this basename: LIPASE, AMYLASE,  in the last 168 hours No results found for this basename: AMMONIA,  in the last 168 hours Coagulation profile  Recent Labs Lab 02/14/14 1505  INR 1.00    CBC:  Recent Labs Lab 02/14/14 1505 02/15/14 0730  WBC 7.4 6.6  NEUTROABS 3.9  --   HGB 13.8 13.4  HCT 37.8* 38.4*  MCV 84.6 87.1  PLT 176 172   Cardiac Enzymes: No results found for this  basename: CKTOTAL, CKMB, CKMBINDEX, TROPONINI,  in the last 168 hours BNP (last 3 results) No results found for this basename: PROBNP,  in the last 8760 hours CBG:  Recent Labs Lab 02/15/14 0648 02/15/14 1140 02/15/14 1628 02/15/14 2222 02/16/14 0635  GLUCAP 137* 137* 201* 151* 175*   D-Dimer: No results found for this basename: DDIMER,  in the last 72 hours Hgb A1c:  Recent Labs  02/15/14 0730  HGBA1C 7.8*   Lipid Profile:  Recent Labs  02/15/14 0500  CHOL 200  HDL 28*  LDLCALC UNABLE TO CALCULATE IF TRIGLYCERIDE OVER 400 mg/dL  TRIG 161*  CHOLHDL 7.1   Thyroid function studies: No results found for this basename: TSH, T4TOTAL, FREET3, T3FREE, THYROIDAB,  in the last 72 hours Anemia work up: No results found for this basename: VITAMINB12, FOLATE, FERRITIN, TIBC, IRON, RETICCTPCT,  in the last 72 hours Sepsis Labs:  Recent Labs Lab 02/14/14 1505 02/15/14 0730  WBC 7.4 6.6   Microbiology No results found for this or any previous visit (from the past 240 hour(s)).   . aspirin  81 mg Oral Daily  .  atorvastatin  20 mg Oral q1800  . enoxaparin (LOVENOX) injection  40 mg Subcutaneous Q24H  . lisinopril  10 mg Oral Daily   And  . hydrochlorothiazide  12.5 mg Oral Daily  . insulin aspart  0-15 Units Subcutaneous TID WC  . insulin aspart  0-5 Units Subcutaneous QHS  . LORazepam  1 mg Intravenous Once  . pramipexole  0.25 mg Oral QHS  . sodium chloride  3 mL Intravenous Q12H   Continuous Infusions:     Assessment/Plan:   L sided Bells palsy - would rec'd  prednisone daily x 1 week at discharge. Will await recs from neuro prior to placing orders. Also needs outpt OT w/ shower seat per OT eval. OK to continue regular diet per speech.   TIA - R sided weakness. MRI/MRA brain and neck neg. TTE neg. No signs of CVA. DM and Lipids uncontrolled and will treat as stated below. adding ASA 81 daily should be adequate as he was on no anticoag prior to admission .    DM2 - SSI while in house, holding metformin. A1C was 7.8 and on metformin 500 BID at admission. Increase to 1000 BID at discharge and f/u outpt   HTN - controlled on home meds   HLD - moderate hypertriglyceridemia w/ incalculable LDL, but likely high given discordant profile. Will start on   lipitor today and f/u w/ LDLP as outpatient. Had myalgia on lovastatin so starting w/ a lower dose to try and achieve tolerance  DVT Prophylaxis - on lovenox   Dispo - if ok w/ neuro , will likely d/c home later today w/ outpatient OT for bells palsy + prednisone therapy. Adding ASA 81 for TIA. Increasing therapy for HLD and DM .    LOS: 2 days   Philip Beasley 02/16/2014, 8:00 AM

## 2014-02-16 NOTE — Progress Notes (Signed)
Occupational Therapy Treatment Patient Details Name: Otto Felkins MRN: [id number] DOB: 04-10-1966 Today's Date: 02/16/2014    History of present illness 48 y.o. male with hx of R Bell's palsy and acute fall presenting with new onset R facial and R arm weakness on Sunday, now with what looks like a L Bell's palsy. He did not receive IV t-PA. Initial CT unrevealing.  Stroke work up underway.   OT comments  Pt seen today for therapeutic exercises for RUE. Pt reports that strength appears slightly improved. Provided pt with HEP and theraband and theraputty to increase strength with pt correctly return demo of all exercises. Educated on use of RUE for functional activities to increase coordination and strength.     Follow Up Recommendations  Outpatient OT (neuro)    Equipment Recommendations  Tub/shower seat    Recommendations for Other Services      Precautions / Restrictions Precautions Precautions: None Restrictions Weight Bearing Restrictions: No       Mobility Bed Mobility                  Transfers Overall transfer level: Needs assistance Equipment used: None Transfers: Sit to/from Stand Sit to Stand: Supervision         General transfer comment: Supervision for safety. Balance appears to be improving.        ADL Overall ADL's : Needs assistance/impaired                                       General ADL Comments: Pt overall at Supervision level for ADLs and functional mobility. Reinforced education on safety at home.                Cognition  Arousal/Alertness: Awake/Alert Behavior During Therapy: WFL for tasks assessed/performed Overall Cognitive Status: Within Functional Limits for tasks assessed                         Exercises Other Exercises Other Exercises: Provided pt with HEP for RUE exercises including shoulder, bicep, tricep, and hand strengthening. Issued theraputty (yellow, soft) and theraband (green level  2). Pt return demo'd exercises accurately.           Pertinent Vitals/ Pain       Pain Assessment: No/denies pain         Frequency Min 2X/week     Progress Toward Goals  OT Goals(current goals can now be found in the care plan section)  Progress towards OT goals: Progressing toward goals  Acute Rehab OT Goals Patient Stated Goal: to go home and back to work OT Goal Formulation: With patient Time For Goal Achievement: 02/22/14 Potential to Achieve Goals: Good ADL Goals Pt Will Transfer to Toilet: with modified independence;ambulating Pt Will Perform Tub/Shower Transfer: Shower transfer;with modified independence;ambulating;rolling walker Pt/caregiver will Perform Home Exercise Program: Increased strength;Right Upper extremity;With theraband;With theraputty;Independently;With written HEP provided  Plan Discharge plan remains appropriate       End of Session Equipment Utilized During Treatment: Other (comment) (theraputty (yellow, soft); theraband (green level 2))   Activity Tolerance Patient tolerated treatment well   Patient Left Other (comment);with call bell/phone within reach (sitting EOB)   Nurse Communication          Time: 4098-1191 OT Time Calculation (min): 29 min  Charges: OT General Charges $OT Visit: 1 Procedure OT Treatments $Therapeutic Exercise: 23-37 mins  Rae Lips [phone] 02/16/2014, 12:26 PM

## 2014-02-16 NOTE — Progress Notes (Signed)
Physical Therapy Treatment Patient Details Name: Philip Beasley MRN: [id number] DOB: 1965/10/25 Today's Date: 02/16/2014    History of Present Illness 48 y.o. male with hx of R Bell's palsy and acute fall presenting with new onset R facial and R arm weakness on Sunday, now with what looks like a L Bell's palsy. He did not receive IV t-PA. Initial CT unrevealing.  Stroke work up underway.    PT Comments    Pt progressing well today with mobility. Denied any dizziness or lightheadedness. Reinforced s/s education on stroke. Believe pt to be at baseline from mobility standpoint. Will sign off at this time.   Follow Up Recommendations  Outpatient PT;Supervision - Intermittent     Equipment Recommendations  None recommended by PT    Recommendations for Other Services       Precautions / Restrictions Precautions Precautions: None Restrictions Weight Bearing Restrictions: No    Mobility  Bed Mobility Overal bed mobility: Independent                Transfers Overall transfer level: Independent Equipment used: None Transfers: Sit to/from Stand Sit to Stand: Supervision         General transfer comment: Supervision for safety. Balance appears to be improving.  Ambulation/Gait Ambulation/Gait assistance: Modified independent (Device/Increase time) Ambulation Distance (Feet): 300 Feet Assistive device: None Gait Pattern/deviations: Scissoring;Narrow base of support (ER LEs) Gait velocity: decreased Gait velocity interpretation: Below normal speed for age/gender General Gait Details: pt scissor ambulation at times; would correct with cues; no LOB with head turns, directional changes or gt speed changes   Stairs Stairs: Yes Stairs assistance: Modified independent (Device/Increase time) Stair Management: Two rails;Alternating pattern;Forwards Number of Stairs: 3 General stair comments: no LOB noted with stair training   Wheelchair Mobility    Modified Rankin  (Stroke Patients Only) Modified Rankin (Stroke Patients Only) Pre-Morbid Rankin Score: No symptoms Modified Rankin: Slight disability     Balance Overall balance assessment: Modified Independent         Standing balance support: During functional activity;No upper extremity supported Standing balance-Leahy Scale: Good               High level balance activites: Direction changes;Head turns;Sudden stops;Turns High Level Balance Comments: no LOB or dizziness noted; cues for widen BOS when scissor ambulating     Cognition Arousal/Alertness: Awake/alert Behavior During Therapy: WFL for tasks assessed/performed Overall Cognitive Status: Within Functional Limits for tasks assessed                      Exercises Other Exercises Other Exercises: Provided pt with HEP for RUE exercises including shoulder, bicep, tricep, and hand strengthening. Issued theraputty (yellow, soft) and theraband (green level 2). Pt return demo'd exercises accurately.    General Comments General comments (skin integrity, edema, etc.): reinforced s/s of stroke       Pertinent Vitals/Pain Pain Assessment: No/denies pain    Home Living                      Prior Function            PT Goals (current goals can now be found in the care plan section) Acute Rehab PT Goals Patient Stated Goal: home today PT Goal Formulation: With patient Time For Goal Achievement: 02/19/14 Potential to Achieve Goals: Good Progress towards PT goals: Goals met/education completed, patient discharged from PT    Frequency  Min 4X/week    PT Plan  Current plan remains appropriate    Co-evaluation             End of Session   Activity Tolerance: Patient tolerated treatment well Patient left: in bed;with call bell/phone within reach;with family/visitor present     Time: 4734-0370 PT Time Calculation (min): 9 min  Charges:  $Gait Training: 8-22 mins                    G Codes:       Philip Beasley, Philip Beasley 02/16/2014, 2:04 PM

## 2014-02-16 NOTE — Progress Notes (Signed)
Speech Language Pathology Treatment: Dysphagia;Cognitive-Linquistic  Patient Details Name: Philip Beasley MRN: [id number] DOB: 04/18/1966 Today's Date: 02/16/2014 Time: 8260-8883 SLP Time Calculation (min): 11 min  Assessment / Plan / Recommendation Clinical Impression  Pt determined to have Bell's Palsy. SLP observed pt finish breakfast with no significant struggle. Reviewed compensatory strategies for safe PO intake. 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 SLP instructed pt in slow, louder, over articulated speech. Pt verbalized understanding. No acute f/u needed as pt is independently following strategies and tolerating diet. If mild dysarthria is persistent, pt may f/u with outpatient SLP.    HPI HPI: Lynx Goodrich is a 48 y.o. male with a history of symptoms that started on Sunday. He states that his been similar symptoms started with the exception that he thinks his right face might have been involved at the very initial stages, but subsequently it became his left face as well as his right arm that were weak. This has now been persistent. He also describes dysphagia. 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 Given his risk factors, neurologist suspects that this represents a small brainstem infarct.    Pertinent Vitals    SLP Plan  Discharge SLP treatment due to (comment);All goals met    Recommendations Diet recommendations: Regular;Thin liquid Liquids provided via: Cup;Straw Medication Administration: Whole meds with liquid Supervision: Patient able to self feed;Intermittent supervision to cue for compensatory strategies Compensations: Small sips/bites;Slow rate;Check for pocketing;Check for anterior loss Postural Changes and/or Swallow Maneuvers: Seated upright 90 degrees              Plan: Discharge SLP treatment due to (comment);All goals met    GO     Herbie [place], MA CCC-SLP [phone]  Lynann Beaver 02/16/2014, 8:16 AM

## 2014-02-16 NOTE — ED Provider Notes (Signed)
I saw and evaluated the patient, reviewed the resident's note and I agree with the findings and plan.   EKG Interpretation   Date/Time:  Wednesday February 14 2014 15:02:22 EDT Ventricular Rate:  81 PR Interval:  204 QRS Duration: 118 QT Interval:  396 QTC Calculation: 460 R Axis:   -21 Text Interpretation:  Normal sinus rhythm Left ventricular hypertrophy  with QRS widening Abnormal ECG ED PHYSICIAN INTERPRETATION AVAILABLE IN  [facility] Confirmed by TEST, Record ([id number]) on 02/16/2014 7:22:12 AM     patinet with facial droop and confusion. Symptoms improved, but since has more than facial droop will admit  Juliet Rude. Rubin Payor, MD 02/16/14 2255

## 2014-02-20 ENCOUNTER — Ambulatory Visit (HOSPITAL_COMMUNITY)
Admission: RE | Admit: 2014-02-20 | Discharge: 2014-02-20 | Disposition: A | Discharge status: Home / Self Care | Payer: BC Managed Care – PPO | Source: Ambulatory Visit | Attending: General Surgery | Admitting: General Surgery

## 2014-02-20 DIAGNOSIS — Z794 Long term (current) use of insulin: Secondary | ICD-10-CM | POA: Insufficient documentation

## 2014-02-20 DIAGNOSIS — E119 Type 2 diabetes mellitus without complications: Secondary | ICD-10-CM

## 2014-02-20 DIAGNOSIS — G4733 Obstructive sleep apnea (adult) (pediatric): Secondary | ICD-10-CM

## 2014-02-20 DIAGNOSIS — IMO0001 Reserved for inherently not codable concepts without codable children: Secondary | ICD-10-CM

## 2014-02-20 DIAGNOSIS — K7689 Other specified diseases of liver: Secondary | ICD-10-CM | POA: Diagnosis not present

## 2014-02-20 DIAGNOSIS — I10 Essential (primary) hypertension: Secondary | ICD-10-CM | POA: Insufficient documentation

## 2014-02-20 DIAGNOSIS — Z6839 Body mass index (BMI) 39.0-39.9, adult: Secondary | ICD-10-CM | POA: Diagnosis not present

## 2014-02-22 ENCOUNTER — Ambulatory Visit (INDEPENDENT_AMBULATORY_CARE_PROVIDER_SITE_OTHER): Payer: BC Managed Care – PPO | Admitting: Surgery

## 2014-03-03 ENCOUNTER — Encounter: Payer: Self-pay | Admitting: Dietician

## 2014-03-03 ENCOUNTER — Encounter: Payer: BC Managed Care – PPO | Attending: General Surgery | Admitting: Dietician

## 2014-03-03 DIAGNOSIS — Z6841 Body Mass Index (BMI) 40.0 and over, adult: Secondary | ICD-10-CM | POA: Insufficient documentation

## 2014-03-03 DIAGNOSIS — Z713 Dietary counseling and surveillance: Secondary | ICD-10-CM | POA: Insufficient documentation

## 2014-03-03 NOTE — Progress Notes (Signed)
  Pre-Op Assessment Visit:  Pre-Operative RYGB Surgery  Medical Nutrition Therapy:  Appt start time: 0800   End time:  0830.  Patient was seen on 03/03/2014 for Pre-Operative RYGB Nutrition Assessment. Assessment and letter of approval faxed to [facility] Bariatric [facility] Program coordinator on 03/03/2014.   Preferred Learning Style:   No preference indicated   Learning Readiness:   Ready   Handouts given during visit include:  Pre-Op Goals Bariatric Surgery Protein Shakes  Teaching Method Utilized:  Visual Auditory  Barriers to learning/adherence to lifestyle change: none  Demonstrated degree of understanding via:  Teach Back   Patient to call the [facility] to enroll in Pre-Op and Post-Op Nutrition Education when surgery date is scheduled.

## 2014-03-13 ENCOUNTER — Ambulatory Visit (HOSPITAL_COMMUNITY)
Admission: RE | Admit: 2014-03-13 | Discharge: 2014-03-13 | Disposition: A | Discharge status: Home / Self Care | Payer: BC Managed Care – PPO | Source: Ambulatory Visit | Attending: General Surgery | Admitting: General Surgery

## 2014-03-13 ENCOUNTER — Encounter (HOSPITAL_COMMUNITY)
Admission: RE | Disposition: A | Discharge status: Home / Self Care | Payer: Self-pay | Source: Ambulatory Visit | Attending: General Surgery

## 2014-03-13 HISTORY — PX: BREATH TEK H PYLORI: SHX5422

## 2014-03-13 LAB — LIPID PANEL
Cholesterol: 188 mg/dL (ref 0–200)
HDL: 36 mg/dL — AB (ref >39)
Total CHOL/HDL Ratio: 5.2 Ratio
Triglycerides: 417 mg/dL — ABNORMAL HIGH (ref <150)

## 2014-03-13 LAB — HEMOGLOBIN A1C
Hgb A1c MFr Bld: 8.2 % — ABNORMAL HIGH (ref <5.7)
MEAN PLASMA GLUCOSE: 189 mg/dL — AB (ref <117)

## 2014-03-13 LAB — CBC
HEMATOCRIT: 39.9 % (ref 39.0–52.0)
HEMOGLOBIN: 14.1 g/dL (ref 13.0–17.0)
MCH: 30.4 pg (ref 26.0–34.0)
MCHC: 35.3 g/dL (ref 30.0–36.0)
MCV: 86 fL (ref 78.0–100.0)
Platelets: 209 10*3/uL (ref 150–400)
RBC: 4.64 MIL/uL (ref 4.22–5.81)
RDW: 14.3 % (ref 11.5–15.5)
WBC: 6.8 10*3/uL (ref 4.0–10.5)

## 2014-03-13 LAB — COMPREHENSIVE METABOLIC PANEL
ALT: 23 U/L (ref 0–53)
AST: 15 U/L (ref 0–37)
Albumin: 4.2 g/dL (ref 3.5–5.2)
Alkaline Phosphatase: 82 U/L (ref 39–117)
BUN: 21 mg/dL (ref 6–23)
CALCIUM: 9.3 mg/dL (ref 8.4–10.5)
CHLORIDE: 102 meq/L (ref 96–112)
CO2: 21 meq/L (ref 19–32)
CREATININE: 0.72 mg/dL (ref 0.50–1.35)
GLUCOSE: 211 mg/dL — AB (ref 70–99)
Potassium: 4.2 mEq/L (ref 3.5–5.3)
Sodium: 135 mEq/L (ref 135–145)
TOTAL PROTEIN: 7.1 g/dL (ref 6.0–8.3)
Total Bilirubin: 0.4 mg/dL (ref 0.2–1.2)

## 2014-03-13 LAB — TSH: TSH: 1.015 u[IU]/mL (ref 0.350–4.500)

## 2014-03-13 LAB — T4: T4, Total: 8 ug/dL (ref 4.5–12.0)

## 2014-03-13 SURGERY — BREATH TEST, FOR HELICOBACTER PYLORI

## 2014-03-13 NOTE — Progress Notes (Signed)
03/13/14 0855  BREATH TEK ASSESSMENT  Referring MD Dr. Glenna Fellows  Time of Last PO Intake 2200 (03/12/2014)  Baseline Breath At: 0808  Pranactin Given At: 0808  Post-Dose Breath At: 0823  Sample 1 1.9%  Sample 2 2.6%  Test Negative

## 2014-03-14 ENCOUNTER — Encounter (HOSPITAL_COMMUNITY): Payer: Self-pay | Admitting: General Surgery

## 2014-03-14 LAB — URINALYSIS
Bilirubin Urine: NEGATIVE
GLUCOSE, UA: 500 mg/dL — AB
HGB URINE DIPSTICK: NEGATIVE
KETONES UR: NEGATIVE mg/dL
Leukocytes, UA: NEGATIVE
Nitrite: NEGATIVE
Protein, ur: NEGATIVE mg/dL
Specific Gravity, Urine: 1.026 (ref 1.005–1.030)
Urobilinogen, UA: 1 mg/dL (ref 0.0–1.0)
pH: 6 (ref 5.0–8.0)

## 2014-04-18 ENCOUNTER — Other Ambulatory Visit (INDEPENDENT_AMBULATORY_CARE_PROVIDER_SITE_OTHER): Payer: Self-pay | Admitting: General Surgery

## 2014-04-30 ENCOUNTER — Encounter: Payer: BC Managed Care – PPO | Attending: General Surgery

## 2014-04-30 DIAGNOSIS — Z6838 Body mass index (BMI) 38.0-38.9, adult: Secondary | ICD-10-CM | POA: Diagnosis not present

## 2014-04-30 DIAGNOSIS — Z713 Dietary counseling and surveillance: Secondary | ICD-10-CM | POA: Diagnosis not present

## 2014-05-02 NOTE — Progress Notes (Signed)
  Pre-Operative Nutrition Class:  Appt start time: 3559   End time:  1830.  Patient was seen on 04/30/2014 for Pre-Operative Bariatric Surgery Education at the [facility].   Surgery date: 05/21/14 Surgery type: RYGB Start weight at [facility]: 313 lbs on 03/03/14 Weight today: 327 lbs  TANITA  BODY COMP RESULTS  04/30/14   BMI (kg/m^2) 43.1   Fat Mass (lbs) 164   Fat Free Mass (lbs) 163   Total Body Water (lbs) 119.5   Samples given per MNT protocol. Patient educated on appropriate usage: Premier protein shake (vanilla - qty 1) Lot #: [id number] Exp: 10/2014  Unjury protein powder (chicken soup - qty 1) Lot #: [id number] Exp: 05/2014  Celebrate Vitamins Multivitamin (pineapple strawberry - qty 1) Lot #: [id number] Exp: 07/2014  PB2 (qty 1) Lot #: [id number] Exp: 01/2015  The following the learning objectives were met by the patient during this course:  Identify Pre-Op Dietary Goals and will begin 2 weeks pre-operatively  Identify appropriate sources of fluids and proteins   State protein recommendations and appropriate sources pre and post-operatively  Identify Post-Operative Dietary Goals and will follow for 2 weeks post-operatively  Identify appropriate multivitamin and calcium sources  Describe the need for physical activity post-operatively and will follow MD recommendations  State when to call healthcare provider regarding medication questions or post-operative complications  Handouts given during class include:  Pre-Op Bariatric Surgery Diet Handout  Protein Shake Handout  Post-Op Bariatric Surgery Nutrition Handout  BELT Program Information Flyer  Support Group Information Flyer  [facility] Bariatric Supplements Price List  Follow-Up Plan: Patient will follow-up at [facility] 2 weeks post operatively for diet advancement per MD.

## 2014-05-04 NOTE — H&P (Signed)
History of Present Illness Philip Beasley(Philip Gravois T. Mishelle Hassan MD; 05/04/2014 11:48 AM) Patient words: pre-op gastric by-pass.  The patient is a 48 year old male who presents with obesity. He presents for his pre op visit prior to planned laparoscopic Roux-en-Y gastric bypass. He gives a history of progressive obesity since Early adolescence despite multiple attempts at medical management. He has been able to lose up to 100 pounds at a time but then has progressive weight regain. his weight has been affecting him in a number of ways including The onset of insulin dependent diabetes mellitus approximately 6 years ago. He also suffers from obstructive sleep apnea and has chronic joint pain. He is very concerned about his health owing to forward at his current weight. His [place] has [place] gastric [place] 2001 markedly improved health. He has successfully completed his preoperative workup. No concerns on psychologic or nutrition evaluation. Lab work significant for elevated triglycerides and hemoglobin A1c as would be expected. Abdominal ultrasound was negative with no gallstones. Other Problems Philip Beasley(Philip [place], [place]; 05/04/2014 11:26 AM) Diabetes Mellitus High blood pressure Other disease, cancer, significant illness Sleep Apnea  Past Surgical History Philip Beasley(Philip Be[place], [place]; 05/04/2014 11:26 AM) No pertinent past surgical history  Diagnostic Studies History Philip Beasley(Philip Beasley, [place]; 05/04/2014 11:26 AM) Colonoscopy never  Allergies Philip Beasley(Philip Moore, MA; 05/04/2014 11:27 AM) No Known Drug Allergies 05/04/2014  Medication History Philip Beasley(Philip Beasley, [place]; 05/04/2014 11:28 AM) Oxycodone-Acetaminophen (5-325MG  Tablet, Oral) Active. Acyclovir (400MG  Tablet, Oral) Active. MetFORMIN HCl (500MG  Tablet, Oral) Active. Atorvastatin Calcium (20MG  Tablet, Oral) Active. Methocarbamol (500MG  Tablet, Oral) Active. Pramipexole Dihydrochloride (0.25MG  Tablet, Oral) Active. TiZANidine HCl (4MG   Tablet, Oral) Active.  Social History Philip Beasley(Philip [place]; 05/04/2014 11:26 AM) Caffeine use Carbonated beverages, Coffee. No alcohol use No drug use Tobacco use Never smoker.  Family History Philip Beasley(Philip Beas[place], [place]; 05/04/2014 11:26 AM) Arthritis Mother. Cerebrovascular Accident Mother. Diabetes Mellitus Brother, Father, Mother. Hypertension Mother.     Review of Systems Philip Beasley(Philip TarkioMoore MA; 05/04/2014 11:26 AM) General Not Present- Appetite Loss, Chills, Fatigue, Fever, Night Sweats, Weight Gain and Weight Loss. Skin Not Present- Change in Wart/Mole, Dryness, Hives, Jaundice, New Lesions, Non-Healing Wounds, Rash and Ulcer. HEENT Not Present- Earache, Hearing Loss, Hoarseness, Nose Bleed, Oral Ulcers, Ringing in the Ears, Seasonal Allergies, Sinus Pain, Sore Throat, Visual Disturbances, Wears glasses/contact lenses and Yellow Eyes. Respiratory Not Present- Bloody sputum, Chronic Cough, Difficulty Breathing, Snoring and Wheezing. Breast Not Present- Breast Mass, Breast Pain, Nipple Discharge and Skin Changes. Cardiovascular Not Present- Chest Pain, Difficulty Breathing Lying Down, Leg Cramps, Palpitations, Rapid Heart Rate, Shortness of Breath and Swelling of Extremities. Gastrointestinal Not Present- Abdominal Pain, Bloating, Bloody Stool, Change in Bowel Habits, Chronic diarrhea, Constipation, Difficulty Swallowing, Excessive gas, Gets full quickly at meals, Hemorrhoids, Indigestion, Nausea, Rectal Pain and Vomiting. Male Genitourinary Not Present- Blood in Urine, Change in Urinary Stream, Frequency, Impotence, Nocturia, Painful Urination, Urgency and Urine Leakage. Musculoskeletal Present- Back Pain. Not Present- Joint Pain, Joint Stiffness, Muscle Pain, Muscle Weakness and Swelling of Extremities. Neurological Not Present- Decreased Memory, Fainting, Headaches, Numbness, Seizures, Tingling, Tremor, Trouble walking and Weakness. Psychiatric Present- Anxiety. Not Present- Bipolar,  Change in Sleep Pattern, Depression, Fearful and Frequent crying. Endocrine Not Present- Cold Intolerance, Excessive Hunger, Hair Changes, Heat Intolerance, Hot flashes and New Diabetes. Hematology Not Present- Easy Bruising, Excessive bleeding, Gland problems, HIV and Persistent Infections.  Vitals Philip Beasley(Philip Moore MA; 05/04/2014 11:26 AM) 05/04/2014 11:26 AM Weight: 315.4 lb Height: 74in Body Surface Area: 2.73 m Body  Mass Index: 40.49 kg/m Temp.: 37F(Temporal)  Pulse: 88 (Regular)  Resp.: 18 (Unlabored)  BP: 138/86 (Sitting, Left Arm, Standard)     Physical Exam Philip Beasley(Philip Goh T. Nandita Mathenia MD; 05/04/2014 11:46 AM)  The physical exam findings are as follows: Note:General: Alert, morbidly obese Caucasian male, in no distress Skin: Warm and dry without rash or infection. HEENT: No palpable masses or thyromegaly. Sclera nonicteric. Pupils equal round and reactive. Oropharynx clear. Lymph nodes: No cervical, supraclavicular, or inguinal nodes palpable. Lungs: Breath sounds clear and equal. No wheezing or increased work of breathing. Cardiovascular: Regular rate and rhythm without murmer. No JVD or edema. Peripheral pulses intact. No carotid bruits. Abdomen: Nondistended. Soft and nontender. No masses palpable. No organomegaly. No palpable hernias. Extremities: No edema or joint swelling or deformity. No chronic venous stasis changes. Neurologic: Alert and fully oriented. Gait normal. No focal weakness. Psychiatric: Normal mood and affect. Thought content appropriate with normal judgement and insight    Assessment & Plan Philip Beasley(Philip Vandrunen T. Rutherford Alarie MD; 05/04/2014 11:47 AM)  MORBID OBESITY (278.01  E66.01) Impression: Ready to proceed with planned laparoscopic Roux-en-Y gastric bypass. We reviewed his recent studies. We discussed the surgery and reviewed the consent form. All of his and his wife's questions were answered. He is given a prescription for his pain  medication.  Current Plans Started OxyCODONE HCl 5MG /5ML, 5-10 Milliliter every four hours, as needed, 200 Milliliter, 05/04/2014, No Refill.

## 2014-05-10 NOTE — Patient Instructions (Addendum)
Philip Beasley  05/10/2014                           YOUR PROCEDURE IS SCHEDULED ON:  05/21/14                ENTER FROM [address] - ENTER THRU EMERGENCY ENTRANCE                 FOLLOW  SIGNS TO SHORT STAY CENTER                 ARRIVE AT SHORT STAY AT:  5:30 AM               CALL THIS NUMBER IF ANY PROBLEMS THE DAY OF SURGERY :               [phone]                                REMEMBER:   Do not eat food or drink liquids AFTER MIDNIGHT                  Take these medicines the morning of surgery with               A SIPS OF WATER :  NONE         Do not wear jewelry, make-up   Do not wear lotions, powders, or perfumes.   Do not shave legs or underarms 12 hrs. before surgery (men may shave face)  Do not bring valuables to the hospital.  Contacts, dentures or bridgework may not be worn into surgery.  Leave suitcase in the car. After surgery it may be brought to your room.  For patients admitted to the hospital more than one night, checkout time is            11:00 AM                                            ________________________________________________________________________                                                                                                  [facility] - PREPARING FOR SURGERY  Before surgery, you can play an important role.  Because skin is not sterile, your skin needs to be as free of germs as possible.  You can reduce the number of germs on your skin by washing with CHG (chlorahexidine gluconate) soap before surgery.  CHG is an antiseptic cleaner which kills germs and bonds with the skin to continue killing germs even after washing. Please DO NOT use if you have an allergy to CHG or antibacterial soaps.  If your skin becomes reddened/irritated stop using the CHG and inform your nurse when you arrive at Short Stay. Do not shave (including legs and underarms) for at least 48 hours prior to the first  CHG shower.  You may  shave your face. Please follow these instructions carefully:   1.  Shower with CHG Soap the night before surgery and the  morning of Surgery.   2.  If you choose to wash your hair, wash your hair first as usual with your  normal  Shampoo.   3.  After you shampoo, rinse your hair and body thoroughly to remove the  shampoo.                                         4.  Use CHG as you would any other liquid soap.  You can apply chg directly  to the skin and wash . Gently wash with scrungie or clean wascloth    5.  Apply the CHG Soap to your body ONLY FROM THE NECK DOWN.   Do not use on open                           Wound or open sores. Avoid contact with eyes, ears mouth and genitals (private parts).                        Genitals (private parts) with your normal soap.              6.  Wash thoroughly, paying special attention to the area where your surgery  will be performed.   7.  Thoroughly rinse your body with warm water from the neck down.   8.  DO NOT shower/wash with your normal soap after using and rinsing off  the CHG Soap .                9.  Pat yourself dry with a clean towel.             10.  Wear clean pajamas.             11.  Place clean sheets on your bed the night of your first shower and do not  sleep with pets.  Day of Surgery : Do not apply any lotions/deodorants the morning of surgery.  Please wear clean clothes to the hospital/surgery center.  FAILURE TO FOLLOW THESE INSTRUCTIONS MAY RESULT IN THE CANCELLATION OF YOUR SURGERY    PATIENT SIGNATURE_________________________________  ______________________________________________________________________

## 2014-05-11 ENCOUNTER — Encounter (HOSPITAL_COMMUNITY)
Admission: RE | Admit: 2014-05-11 | Discharge: 2014-05-11 | Disposition: A | Discharge status: Home / Self Care | Payer: BC Managed Care – PPO | Source: Ambulatory Visit | Attending: General Surgery | Admitting: General Surgery

## 2014-05-11 ENCOUNTER — Encounter (HOSPITAL_COMMUNITY): Payer: Self-pay

## 2014-05-11 DIAGNOSIS — Z01812 Encounter for preprocedural laboratory examination: Secondary | ICD-10-CM | POA: Diagnosis not present

## 2014-05-11 HISTORY — DX: Cerebral infarction, unspecified: I63.9

## 2014-05-11 HISTORY — DX: Dorsalgia, unspecified: M54.9

## 2014-05-11 HISTORY — DX: Sleep apnea, unspecified: G47.30

## 2014-05-11 HISTORY — DX: Unspecified osteoarthritis, unspecified site: M19.90

## 2014-05-11 LAB — COMPREHENSIVE METABOLIC PANEL
ALT: 34 U/L (ref 0–53)
AST: 27 U/L (ref 0–37)
Albumin: 4.1 g/dL (ref 3.5–5.2)
Alkaline Phosphatase: 70 U/L (ref 39–117)
Anion gap: 14 (ref 5–15)
BUN: 18 mg/dL (ref 6–23)
CALCIUM: 9.9 mg/dL (ref 8.4–10.5)
CO2: 24 mEq/L (ref 19–32)
Chloride: 102 mEq/L (ref 96–112)
Creatinine, Ser: 0.65 mg/dL (ref 0.50–1.35)
GFR calc Af Amer: >90 mL/min (ref >90)
GFR calc non Af Amer: >90 mL/min (ref >90)
Glucose, Bld: 136 mg/dL — ABNORMAL HIGH (ref 70–99)
Potassium: 4.3 mEq/L (ref 3.7–5.3)
SODIUM: 140 meq/L (ref 137–147)
TOTAL PROTEIN: 7.7 g/dL (ref 6.0–8.3)
Total Bilirubin: 0.5 mg/dL (ref 0.3–1.2)

## 2014-05-11 LAB — CBC WITH DIFFERENTIAL/PLATELET
Basophils Absolute: 0 10*3/uL (ref 0.0–0.1)
Basophils Relative: 0 % (ref 0–1)
EOS ABS: 0.1 10*3/uL (ref 0.0–0.7)
EOS PCT: 2 % (ref 0–5)
HCT: 41.8 % (ref 39.0–52.0)
Hemoglobin: 14.4 g/dL (ref 13.0–17.0)
LYMPHS PCT: 36 % (ref 12–46)
Lymphs Abs: 2.4 10*3/uL (ref 0.7–4.0)
MCH: 30.1 pg (ref 26.0–34.0)
MCHC: 34.4 g/dL (ref 30.0–36.0)
MCV: 87.3 fL (ref 78.0–100.0)
Monocytes Absolute: 0.4 10*3/uL (ref 0.1–1.0)
Monocytes Relative: 6 % (ref 3–12)
Neutro Abs: 3.8 10*3/uL (ref 1.7–7.7)
Neutrophils Relative %: 56 % (ref 43–77)
PLATELETS: 231 10*3/uL (ref 150–400)
RBC: 4.79 MIL/uL (ref 4.22–5.81)
RDW: 12.9 % (ref 11.5–15.5)
WBC: 6.8 10*3/uL (ref 4.0–10.5)

## 2014-05-20 NOTE — Anesthesia Preprocedure Evaluation (Signed)
Anesthesia Evaluation  Patient identified by MRN, date of birth, ID band Patient awake    Reviewed: Allergy & Precautions, H&P , NPO status , Patient's Chart, lab work & pertinent test results  History of Anesthesia Complications Negative for: history of anesthetic complications  Airway Mallampati: III  TM Distance: >3 FB Neck ROM: Full    Dental no notable dental hx. (+) Dental Advisory Given   Pulmonary sleep apnea ,  breath sounds clear to auscultation  Pulmonary exam normal       Cardiovascular hypertension, Pt. on medications Rhythm:Regular Rate:Normal     Neuro/Psych TIAnegative psych ROS   GI/Hepatic negative GI ROS, Neg liver ROS,   Endo/Other  diabetes, Type 2, Insulin Dependent, Oral Hypoglycemic AgentsMorbid obesity  Renal/GU negative Renal ROS  negative genitourinary   Musculoskeletal  (+) Arthritis -, Osteoarthritis,    Abdominal (+) + obese,   Peds negative pediatric ROS (+)  Hematology negative hematology ROS (+)   Anesthesia Other Findings   Reproductive/Obstetrics negative OB ROS                             Anesthesia Physical Anesthesia Plan  ASA: III  Anesthesia Plan: General   Post-op Pain Management:    Induction: Intravenous  Airway Management Planned: Oral ETT  Additional Equipment:   Intra-op Plan:   Post-operative Plan: Extubation in OR  Informed Consent: I have reviewed the patients History and Physical, chart, labs and discussed the procedure including the risks, benefits and alternatives for the proposed anesthesia with the patient or authorized representative who has indicated his/her understanding and acceptance.   Dental advisory given  Plan Discussed with: CRNA  Anesthesia Plan Comments:         Anesthesia Quick Evaluation

## 2014-05-21 ENCOUNTER — Inpatient Hospital Stay (HOSPITAL_COMMUNITY): Payer: BC Managed Care – PPO | Admitting: Anesthesiology

## 2014-05-21 ENCOUNTER — Inpatient Hospital Stay (HOSPITAL_COMMUNITY)
Admission: RE | Admit: 2014-05-21 | Discharge: 2014-05-23 | DRG: 621 | Disposition: A | Discharge status: Home / Self Care | Payer: BC Managed Care – PPO | Source: Ambulatory Visit | Attending: General Surgery | Admitting: General Surgery

## 2014-05-21 ENCOUNTER — Encounter (HOSPITAL_COMMUNITY): Payer: Self-pay

## 2014-05-21 ENCOUNTER — Encounter (HOSPITAL_COMMUNITY)
Admission: RE | Disposition: A | Discharge status: Home / Self Care | Payer: Self-pay | Source: Ambulatory Visit | Attending: General Surgery

## 2014-05-21 DIAGNOSIS — I10 Essential (primary) hypertension: Secondary | ICD-10-CM | POA: Diagnosis present

## 2014-05-21 DIAGNOSIS — Z7982 Long term (current) use of aspirin: Secondary | ICD-10-CM | POA: Diagnosis not present

## 2014-05-21 DIAGNOSIS — E119 Type 2 diabetes mellitus without complications: Secondary | ICD-10-CM | POA: Diagnosis present

## 2014-05-21 DIAGNOSIS — Z9884 Bariatric surgery status: Secondary | ICD-10-CM

## 2014-05-21 DIAGNOSIS — Z6841 Body Mass Index (BMI) 40.0 and over, adult: Secondary | ICD-10-CM | POA: Diagnosis not present

## 2014-05-21 DIAGNOSIS — G4733 Obstructive sleep apnea (adult) (pediatric): Secondary | ICD-10-CM | POA: Diagnosis present

## 2014-05-21 DIAGNOSIS — M2550 Pain in unspecified joint: Secondary | ICD-10-CM | POA: Diagnosis present

## 2014-05-21 DIAGNOSIS — Z794 Long term (current) use of insulin: Secondary | ICD-10-CM

## 2014-05-21 HISTORY — PX: GASTRIC ROUX-EN-Y: SHX5262

## 2014-05-21 LAB — GLUCOSE, CAPILLARY
Glucose-Capillary: 99 mg/dL (ref 70–99)
Glucose-Capillary: 241 mg/dL — ABNORMAL HIGH (ref 70–99)

## 2014-05-21 LAB — HEMOGLOBIN AND HEMATOCRIT, BLOOD
HCT: 41.2 % (ref 39.0–52.0)
Hemoglobin: 14 g/dL (ref 13.0–17.0)

## 2014-05-21 SURGERY — LAPAROSCOPIC ROUX-EN-Y GASTRIC BYPASS WITH UPPER ENDOSCOPY
Anesthesia: General | Site: Abdomen

## 2014-05-21 MED ORDER — LACTATED RINGERS IR SOLN
Status: DC | PRN
  Administered 2014-05-21: 3000 mL

## 2014-05-21 MED ORDER — FENTANYL CITRATE 0.05 MG/ML IJ SOLN
INTRAMUSCULAR | Status: AC
  Filled 2014-05-21: qty 5

## 2014-05-21 MED ORDER — TISSEEL VH 10 ML EX KIT
PACK | CUTANEOUS | Status: AC
  Filled 2014-05-21: qty 2

## 2014-05-21 MED ORDER — ACETAMINOPHEN 160 MG/5ML PO SOLN
650.0000 mg | ORAL | Status: DC | PRN
  Administered 2014-05-23: 650 mg via ORAL
  Filled 2014-05-21: qty 20.3

## 2014-05-21 MED ORDER — FENTANYL CITRATE 0.05 MG/ML IJ SOLN
INTRAMUSCULAR | Status: AC
  Filled 2014-05-21: qty 2

## 2014-05-21 MED ORDER — FENTANYL CITRATE 0.05 MG/ML IJ SOLN
INTRAMUSCULAR | Status: DC | PRN
  Administered 2014-05-21: 100 ug via INTRAVENOUS
  Administered 2014-05-21 (×5): 50 ug via INTRAVENOUS

## 2014-05-21 MED ORDER — PROPOFOL 10 MG/ML IV BOLUS
INTRAVENOUS | Status: AC
  Filled 2014-05-21: qty 20

## 2014-05-21 MED ORDER — EPHEDRINE SULFATE 50 MG/ML IJ SOLN
INTRAMUSCULAR | Status: AC
  Filled 2014-05-21: qty 1

## 2014-05-21 MED ORDER — DEXTROSE 5 % IV SOLN
INTRAVENOUS | Status: AC
  Filled 2014-05-21: qty 2

## 2014-05-21 MED ORDER — NEOSTIGMINE METHYLSULFATE 10 MG/10ML IV SOLN
INTRAVENOUS | Status: DC | PRN
  Administered 2014-05-21: 4 mg via INTRAVENOUS

## 2014-05-21 MED ORDER — MORPHINE SULFATE 10 MG/ML IJ SOLN
INTRAMUSCULAR | Status: AC
  Filled 2014-05-21: qty 1

## 2014-05-21 MED ORDER — SODIUM CHLORIDE 0.9 % IJ SOLN
INTRAMUSCULAR | Status: AC
  Filled 2014-05-21: qty 10

## 2014-05-21 MED ORDER — TISSEEL VH 10 ML EX KIT
PACK | CUTANEOUS | Status: DC | PRN
  Administered 2014-05-21: 10 mL

## 2014-05-21 MED ORDER — SUCCINYLCHOLINE CHLORIDE 20 MG/ML IJ SOLN
INTRAMUSCULAR | Status: DC | PRN
  Administered 2014-05-21: 200 mg via INTRAVENOUS

## 2014-05-21 MED ORDER — CISATRACURIUM BESYLATE 20 MG/10ML IV SOLN
INTRAVENOUS | Status: AC
  Filled 2014-05-21: qty 10

## 2014-05-21 MED ORDER — ONDANSETRON HCL 4 MG/2ML IJ SOLN
INTRAMUSCULAR | Status: AC
  Filled 2014-05-21: qty 2

## 2014-05-21 MED ORDER — CISATRACURIUM BESYLATE (PF) 10 MG/5ML IV SOLN
INTRAVENOUS | Status: DC | PRN
  Administered 2014-05-21: 2 mg via INTRAVENOUS
  Administered 2014-05-21: 8 mg via INTRAVENOUS
  Administered 2014-05-21 (×2): 6 mg via INTRAVENOUS
  Administered 2014-05-21: 2 mg via INTRAVENOUS

## 2014-05-21 MED ORDER — UNJURY VANILLA POWDER: 2.0000 [oz_av] | Freq: Four times a day (QID) | ORAL | Status: DC

## 2014-05-21 MED ORDER — OXYCODONE HCL 5 MG/5ML PO SOLN: 5.0000 mg | ORAL | Status: DC | PRN

## 2014-05-21 MED ORDER — ONDANSETRON HCL 4 MG/2ML IJ SOLN: 4.0000 mg | Freq: Once | INTRAMUSCULAR | Status: DC | PRN

## 2014-05-21 MED ORDER — HEPARIN SODIUM (PORCINE) 5000 UNIT/ML IJ SOLN
5000.0000 [IU] | INTRAMUSCULAR | Status: AC
  Administered 2014-05-21: 5000 [IU] via SUBCUTANEOUS
  Filled 2014-05-21: qty 1

## 2014-05-21 MED ORDER — ONDANSETRON HCL 4 MG/2ML IJ SOLN
INTRAMUSCULAR | Status: DC | PRN
  Administered 2014-05-21: 4 mg via INTRAVENOUS

## 2014-05-21 MED ORDER — EPHEDRINE SULFATE 50 MG/ML IJ SOLN
INTRAMUSCULAR | Status: DC | PRN
  Administered 2014-05-21 (×2): 10 mg via INTRAVENOUS

## 2014-05-21 MED ORDER — DEXTROSE 5 % IV SOLN
2.0000 g | INTRAVENOUS | Status: AC
  Administered 2014-05-21 (×2): 2 g via INTRAVENOUS

## 2014-05-21 MED ORDER — UNJURY CHICKEN SOUP POWDER: 2.0000 [oz_av] | Freq: Four times a day (QID) | ORAL | Status: DC

## 2014-05-21 MED ORDER — FENTANYL CITRATE 0.05 MG/ML IJ SOLN
25.0000 ug | INTRAMUSCULAR | Status: DC | PRN
  Administered 2014-05-21: 50 ug via INTRAVENOUS

## 2014-05-21 MED ORDER — MIDAZOLAM HCL 2 MG/2ML IJ SOLN
INTRAMUSCULAR | Status: AC
  Filled 2014-05-21: qty 2

## 2014-05-21 MED ORDER — DEXAMETHASONE SODIUM PHOSPHATE 10 MG/ML IJ SOLN
INTRAMUSCULAR | Status: DC | PRN
  Administered 2014-05-21: 10 mg via INTRAVENOUS

## 2014-05-21 MED ORDER — MIDAZOLAM HCL 5 MG/5ML IJ SOLN
INTRAMUSCULAR | Status: DC | PRN
  Administered 2014-05-21: 2 mg via INTRAVENOUS

## 2014-05-21 MED ORDER — LACTATED RINGERS IV SOLN
INTRAVENOUS | Status: DC
  Administered 2014-05-21: 12:00:00 via INTRAVENOUS

## 2014-05-21 MED ORDER — PROPOFOL 10 MG/ML IV BOLUS
INTRAVENOUS | Status: DC | PRN
  Administered 2014-05-21: 200 mg via INTRAVENOUS

## 2014-05-21 MED ORDER — GLYCOPYRROLATE 0.2 MG/ML IJ SOLN
INTRAMUSCULAR | Status: DC | PRN
  Administered 2014-05-21: 0.6 mg via INTRAVENOUS

## 2014-05-21 MED ORDER — DEXAMETHASONE SODIUM PHOSPHATE 10 MG/ML IJ SOLN
INTRAMUSCULAR | Status: AC
  Filled 2014-05-21: qty 1

## 2014-05-21 MED ORDER — POTASSIUM CHLORIDE IN NACL 20-0.9 MEQ/L-% IV SOLN
INTRAVENOUS | Status: DC
  Administered 2014-05-21: 18:00:00 via INTRAVENOUS
  Administered 2014-05-22: 100 mL/h via INTRAVENOUS
  Administered 2014-05-22 – 2014-05-23 (×2): via INTRAVENOUS
  Administered 2014-05-23: 1000 mL via INTRAVENOUS
  Filled 2014-05-21 (×6): qty 1000

## 2014-05-21 MED ORDER — GLYCOPYRROLATE 0.2 MG/ML IJ SOLN
INTRAMUSCULAR | Status: AC
  Filled 2014-05-21: qty 3

## 2014-05-21 MED ORDER — ONDANSETRON HCL 4 MG/2ML IJ SOLN
4.0000 mg | INTRAMUSCULAR | Status: DC | PRN
Start: 2014-05-21 — End: 2014-05-23

## 2014-05-21 MED ORDER — NEOSTIGMINE METHYLSULFATE 10 MG/10ML IV SOLN
INTRAVENOUS | Status: AC
  Filled 2014-05-21: qty 1

## 2014-05-21 MED ORDER — ACETAMINOPHEN 10 MG/ML IV SOLN
1000.0000 mg | Freq: Once | INTRAVENOUS | Status: AC
  Administered 2014-05-21: 1000 mg via INTRAVENOUS
  Filled 2014-05-21: qty 100

## 2014-05-21 MED ORDER — BUPIVACAINE-EPINEPHRINE 0.25% -1:200000 IJ SOLN
INTRAMUSCULAR | Status: AC
  Filled 2014-05-21: qty 1

## 2014-05-21 MED ORDER — FENTANYL CITRATE 0.05 MG/ML IJ SOLN
25.0000 ug | INTRAMUSCULAR | Status: DC | PRN
  Administered 2014-05-21 (×3): 50 ug via INTRAVENOUS

## 2014-05-21 MED ORDER — LACTATED RINGERS IV SOLN
INTRAVENOUS | Status: DC | PRN
  Administered 2014-05-21 (×2): via INTRAVENOUS

## 2014-05-21 MED ORDER — ACETAMINOPHEN 160 MG/5ML PO SOLN: 325.0000 mg | ORAL | Status: DC | PRN

## 2014-05-21 MED ORDER — UNJURY CHOCOLATE CLASSIC POWDER
2.0000 [oz_av] | Freq: Four times a day (QID) | ORAL | Status: DC
  Administered 2014-05-23: 2 [oz_av] via ORAL

## 2014-05-21 MED ORDER — CHLORHEXIDINE GLUCONATE CLOTH 2 % EX PADS: 6.0000 | MEDICATED_PAD | Freq: Once | CUTANEOUS | Status: DC

## 2014-05-21 MED ORDER — MORPHINE SULFATE 10 MG/ML IJ SOLN
2.0000 mg | INTRAMUSCULAR | Status: DC | PRN
  Administered 2014-05-21 (×2): 6 mg via INTRAVENOUS
  Administered 2014-05-21 (×2): 4 mg via INTRAVENOUS
  Administered 2014-05-22 (×3): 6 mg via INTRAVENOUS
  Administered 2014-05-22: 4 mg via INTRAVENOUS
  Administered 2014-05-22 (×3): 6 mg via INTRAVENOUS
  Filled 2014-05-21 (×10): qty 1

## 2014-05-21 MED ORDER — BUPIVACAINE-EPINEPHRINE 0.25% -1:200000 IJ SOLN
INTRAMUSCULAR | Status: DC | PRN
  Administered 2014-05-21: 30 mL

## 2014-05-21 MED ORDER — HEPARIN SODIUM (PORCINE) 5000 UNIT/ML IJ SOLN
5000.0000 [IU] | Freq: Three times a day (TID) | INTRAMUSCULAR | Status: DC
  Administered 2014-05-21 – 2014-05-23 (×5): 5000 [IU] via SUBCUTANEOUS
  Filled 2014-05-21 (×8): qty 1

## 2014-05-21 SURGICAL SUPPLY — 78 items
APL SRG 32X5 SNPLK LF DISP (MISCELLANEOUS) ×1
APPLICATOR COTTON TIP 6IN STRL (MISCELLANEOUS) ×4 IMPLANT
APPLIER CLIP ROT 13.4 12 LRG (CLIP)
APR CLP LRG 13.4X12 ROT 20 MLT (CLIP)
BAG SPEC RTRVL LRG 6X4 10 (ENDOMECHANICALS)
BLADE SURG SZ11 CARB STEEL (BLADE) ×2 IMPLANT
CABLE HIGH FREQUENCY MONO STRZ (ELECTRODE) ×2 IMPLANT
CANISTER SUCT 3000ML (MISCELLANEOUS) ×2 IMPLANT
CHLORAPREP W/TINT 26ML (MISCELLANEOUS) ×2 IMPLANT
CLIP APPLIE ROT 13.4 12 LRG (CLIP) IMPLANT
CLIP SUT LAPRA TY ABSORB (SUTURE) ×5 IMPLANT
CUTTER FLEX LINEAR 45M (STAPLE) ×2 IMPLANT
DECANTER SPIKE VIAL GLASS SM (MISCELLANEOUS) ×2 IMPLANT
DEVICE SUTURE ENDOST 10MM (ENDOMECHANICALS) IMPLANT
DISSECTOR BLUNT TIP ENDO 5MM (MISCELLANEOUS) IMPLANT
DRAIN PENROSE 18X1/4 LTX STRL (WOUND CARE) ×2 IMPLANT
DRAPE CAMERA CLOSED 9X96 (DRAPES) ×2 IMPLANT
ELECT REM PT RETURN 9FT ADLT (ELECTROSURGICAL) ×2
ELECTRODE REM PT RTRN 9FT ADLT (ELECTROSURGICAL) ×1 IMPLANT
GAUZE SPONGE 4X4 12PLY STRL (GAUZE/BANDAGES/DRESSINGS) ×2 IMPLANT
GAUZE SPONGE 4X4 16PLY XRAY LF (GAUZE/BANDAGES/DRESSINGS) ×2 IMPLANT
GLOVE BIOGEL PI IND STRL 7.5 (GLOVE) ×1 IMPLANT
GLOVE BIOGEL PI INDICATOR 7.5 (GLOVE) ×3
GLOVE SS BIOGEL STRL SZ 7.5 (GLOVE) ×1 IMPLANT
GLOVE SUPERSENSE BIOGEL SZ 7.5 (GLOVE) ×3
GOWN STRL REUS W/TWL XL LVL3 (GOWN DISPOSABLE) ×6 IMPLANT
HEMOSTAT SURGICEL 4X8 (HEMOSTASIS) IMPLANT
HOVERMATT SINGLE USE (MISCELLANEOUS) ×2 IMPLANT
KIT BASIN OR (CUSTOM PROCEDURE TRAY) ×2 IMPLANT
KIT GASTRIC LAVAGE 34FR ADT (SET/KITS/TRAYS/PACK) ×2 IMPLANT
LIQUID BAND (GAUZE/BANDAGES/DRESSINGS) ×2 IMPLANT
NDL SPNL 22GX3.5 QUINCKE BK (NEEDLE) ×1 IMPLANT
NEEDLE SPNL 22GX3.5 QUINCKE BK (NEEDLE) ×2 IMPLANT
PACK CARDIOVASCULAR III (CUSTOM PROCEDURE TRAY) ×2 IMPLANT
PEN SKIN MARKING BROAD (MISCELLANEOUS) ×2 IMPLANT
POUCH SPECIMEN RETRIEVAL 10MM (ENDOMECHANICALS) IMPLANT
RELOAD 45 VASCULAR/THIN (ENDOMECHANICALS) ×2 IMPLANT
RELOAD ENDO STITCH 2.0 (ENDOMECHANICALS)
RELOAD STAPLE 45 2.5 WHT GRN (ENDOMECHANICALS) ×1 IMPLANT
RELOAD STAPLE 45 3.5 BLU ETS (ENDOMECHANICALS) ×1 IMPLANT
RELOAD STAPLE 60 2.6 WHT THN (STAPLE) ×1 IMPLANT
RELOAD STAPLE TA45 3.5 REG BLU (ENDOMECHANICALS) ×4 IMPLANT
RELOAD STAPLER BLUE 60MM (STAPLE) ×1 IMPLANT
RELOAD STAPLER GOLD 60MM (STAPLE) ×1 IMPLANT
RELOAD STAPLER WHITE 60MM (STAPLE) ×1 IMPLANT
SCISSORS LAP 5X45 EPIX DISP (ENDOMECHANICALS) ×2 IMPLANT
SEALANT SURGICAL APPL DUAL CAN (MISCELLANEOUS) ×2 IMPLANT
SET IRRIG TUBING LAPAROSCOPIC (IRRIGATION / IRRIGATOR) ×2 IMPLANT
SHEARS CURVED HARMONIC AC 45CM (MISCELLANEOUS) ×2 IMPLANT
SLEEVE ADV FIXATION 12X100MM (TROCAR) ×4 IMPLANT
SOLUTION ANTI FOG 6CC (MISCELLANEOUS) ×2 IMPLANT
STAPLE ECHEON FLEX 60 POW ENDO (STAPLE) ×1 IMPLANT
STAPLER ECHELON LONG 60 440 (INSTRUMENTS) IMPLANT
STAPLER RELOAD BLUE 60MM (STAPLE) ×2
STAPLER RELOAD GOLD 60MM (STAPLE) ×2
STAPLER RELOAD WHITE 60MM (STAPLE) ×2
STAPLER VISISTAT 35W (STAPLE) ×2 IMPLANT
SUT DEVICE BRAIDED 0X39 (SUTURE) IMPLANT
SUT DVC SILK 2.0X39 (SUTURE) ×11 IMPLANT
SUT DVC VICRYL PGA 2.0X39 (SUTURE) ×12 IMPLANT
SUT MNCRL AB 4-0 PS2 18 (SUTURE) ×2 IMPLANT
SUT RELOAD ENDO STITCH 2 48X1 (ENDOMECHANICALS)
SUT RELOAD ENDO STITCH 2.0 (ENDOMECHANICALS)
SUT VIC AB 2-0 SH 27 (SUTURE)
SUT VIC AB 2-0 SH 27X BRD (SUTURE) IMPLANT
SUTURE RELOAD END STTCH 2 48X1 (ENDOMECHANICALS) IMPLANT
SUTURE RELOAD ENDO STITCH 2.0 (ENDOMECHANICALS) IMPLANT
SYR 20CC LL (SYRINGE) ×4 IMPLANT
SYR 50ML LL SCALE MARK (SYRINGE) ×2 IMPLANT
TOWEL OR 17X26 10 PK STRL BLUE (TOWEL DISPOSABLE) ×2 IMPLANT
TOWEL OR NON WOVEN STRL DISP B (DISPOSABLE) ×2 IMPLANT
TRAY FOLEY CATH 14FRSI W/METER (CATHETERS) ×3 IMPLANT
TROCAR ADV FIXATION 12X100MM (TROCAR) ×2 IMPLANT
TROCAR ADV FIXATION 5X100MM (TROCAR) ×2 IMPLANT
TROCAR BLADELESS OPT 5 100 (ENDOMECHANICALS) ×2 IMPLANT
TROCAR XCEL 12X100 BLDLESS (ENDOMECHANICALS) ×2 IMPLANT
TUBING ENDO SMARTCAP PENTAX (MISCELLANEOUS) ×2 IMPLANT
TUBING FILTER THERMOFLATOR (ELECTROSURGICAL) ×2 IMPLANT

## 2014-05-21 NOTE — H&P (View-Only) (Signed)
  Pre-Operative Nutrition Class:  Appt start time: 3559   End time:  1830.  Patient was seen on 04/30/2014 for Pre-Operative Bariatric Surgery Education at [facility].   Surgery date: 05/21/14 Surgery type: RYGB Start weight at [facility]: 313 lbs on 03/03/14 Weight today: 327 lbs  TANITA  BODY COMP RESULTS  04/30/14   BMI (kg/m^2) 43.1   Fat Mass (lbs) 164   Fat Free Mass (lbs) 163   Total Body Water (lbs) 119.5   Samples given per MNT protocol. Patient educated on appropriate usage: Premier protein shake (vanilla - qty 1) Lot #: [id number] Exp: 10/2014  Unjury protein powder (chicken soup - qty 1) Lot #: [id number] Exp: 05/2014  Celebrate Vitamins Multivitamin (pineapple strawberry - qty 1) Lot #: [id number] Exp: 07/2014  PB2 (qty 1) Lot #: [id number] Exp: 01/2015  The following the learning objectives were met by the patient during this course:  Identify Pre-Op Dietary Goals and will begin 2 weeks pre-operatively  Identify appropriate sources of fluids and proteins   State protein recommendations and appropriate sources pre and post-operatively  Identify Post-Operative Dietary Goals and will follow for 2 weeks post-operatively  Identify appropriate multivitamin and calcium sources  Describe the need for physical activity post-operatively and will follow MD recommendations  State when to call healthcare provider regarding medication questions or post-operative complications  Handouts given during class include:  Pre-Op Bariatric Surgery Diet Handout  Protein Shake Handout  Post-Op Bariatric Surgery Nutrition Handout  BELT Program Information Flyer  Support Group Information Flyer  [facility] Bariatric Supplements Price List  Follow-Up Plan: Patient will follow-up at [facility] 2 weeks post operatively for diet advancement per MD.

## 2014-05-21 NOTE — Op Note (Signed)
Preop diagnosis: Morbid obesity  Postop diagnosis: Morbid obesity  Body mass index is 38.26 kg/(m^2).  Surgical procedure: Laparoscopic Roux-en-Y gastric bypass  Surgeon: Sharlet SalinaBenjamin T.Ambrea Hegler M.D.  Asst.: Ovidio Kinavid Newman M.D.  Anesthesia: General  Complications:  None  EBL: Minimal  Drains: None  Disposition: PACU in good condition  Description of procedure: Patient is brought to the operating room and general anesthesia induced. She had received preoperative broad-spectrum IV antibiotics and subcutaneous heparin. The abdomen was widely sterilely prepped and draped. Patient timeout was performed and correct patient and procedure confirmed. Access was obtained with a 12 mm Optiview trocar in the left upper quadrant and pneumoperitoneum established without difficulty. Under direct vision 12 mm trocars were placed laterally in the right upper quadrant, right upper quadrant midclavicular line, and to the left and above the umbilicus for the camera port. A 5 mm trocar was placed laterally in the left upper quadrant. The omentum was brought into the upper abdomen and the transverse mesocolon elevated and the ligament of Treitz clearly identified. A 40 cm biliopancreatic limb was then carefully measured from the ligament of Treitz. The small intestine was divided at this point with a single firing of the white load linear stapler. A Penrose drain was sutured to the end of the Roux-en-Y limb for later identification. A 100 cm Roux-en-Y limb was then carefully measured. At this point a side-to-side anastomosis was created between the Roux limb and the end of the biliopancreatic limb. This was accomplished with a single firing of the 45 mm white load linear stapler. The common enterotomy was closed with a running 2-0 Vicryl begun at either end of the enterotomy and tied centrally. The mesenteric defect was then closed with running 2-0 silk. The omentum was then divided with the harmonic scalpel up towards  the transverse colon to allow mobility of the Roux limb toward the gastric pouch. The patient was then placed in steep reversed Trendelenburg. Through a 5 mm subxiphoid site the [facility] retractor was placed and the left lobe of the liver elevated with excellent exposure of the upper stomach and hiatus. The angle of Hiss was then mobilized with the harmonic scalpel. A 4 cm gastric pouch was then carefully measured along the lesser curve of the stomach. Dissection was carried along the lesser curve at this point with the Harmonic scalpel working carefully back toward the lesser sac at right angles to the lesser curve. The free lesser sac was then entered. After being sure all tubes were removed from the stomach an initial firing of the gold load 60 mm linear stapler was fired at right angles across the lesser curve for about 4 cm. The gastric pouch was further mobilized posteriorly and then the pouch was completed with 2 further firings of the 60 mm blue load linear stapler up through the previously dissected angle of His. It was ensured that the pouch was completely mobilized away from the gastric remnant. This created a nice tubular 4-5 cm gastric pouch. The staple line of the gastric remnant was then oversewn with 2-0 silk for hemostasis. The Roux limb was then brought up in an antecolic fashion with the candycane facing to the patient's left without undue tension. The gastrojejunostomy was created with an initial posterior row of 2-0 Vicryl between the Roux limb and the staple line of the gastric pouch. Enterotomies were then made in the gastric pouch and the Roux limb with the harmonic scalpel and at approximately 2-2-1/2 cm anastomosis was created with a single  firing of the blue load linear stapler. The staple line was inspected and was intact without bleeding. The common enterotomy was then closed with running 2-0 Vicryl begun at either end and tied centrally. The wall tube was then easily passed through the  anastomosis and an outer anterior layer of running 2-0 Vicryl was placed. The Ewald tube was removed. With the outlet of the gastrojejunostomy clamped and under saline irrigation the assistant performed upper endoscopy and with the gastric pouch tensely distended with air there was a small stream of bubbles from the right corner of the anastamosis anteriorly, possibly from a suture hole as I did not see any defect in the anastamosis. I placed an additional 2-0 Vicryl mattress suture deeply at this point and on repeat insufflation there was no more bubbling under tight distention and the anastamosis was patent.. The pouch was desufflated. The Vonita Mosseterson defect was closed with running 2-0 silk. The abdomen was inspected for any evidence of bleeding or bowel injury and everything looked fine. The Nathanson retractor was removed under direct vision after coating the anastomosis with Tisseel tissue sealant. All CO2 was evacuated and trochars removed. Skin incisions were closed with staples. Sponge needle and instrument counts were correct. The patient was taken to the PACU in good condition.     Philip SaaBenjamin T Cohl Behrens MD, FACS  05/21/2014, 10:23 AM

## 2014-05-21 NOTE — Op Note (Signed)
Name:  Philip Beasley MRN: [id number] Date of Surgery: 05/21/2014  Preop Diagnosis:  Morbid Obesity, S/P RYGB  Postop Diagnosis:  Morbid Obesity (weight - 315, BMI - 40.49), S/P RYGB  Procedure:  Upper endoscopy  (Intraoperative)  Surgeon:  Ovidio Kinavid Madalina Rosman, M.D.  Anesthesia:  GET  Indications for procedure: Philip Beasley is a 48 y.o. male whose primary care physician is Alysia PennaHOLWERDA, SCOTT, MD and has completed a Roux-en-Y gastric bypass today by Dr. Johna SheriffHoxworth.  I am doing an intraoperative upper endoscopy to evaluate the gastric pouch and the gastro-jejunal anastomosis.  Operative Note: The patient is under general anesthesia.  Dr. Johna SheriffHoxworth is laparoscoping the patient while I do an upper endoscopy to evaluate the stomach pouch and gastrojejunal anastomosis.  With the patient intubated, I passed the Pentax endoscope without difficulty down the esophagus.  The esophago-gastric junction was at 40 cm.  The gastro-jejunal anastomosis was at 45 cm.  The mucosa of the stomach looked viable and the staple line was intact without bleeding.  The gastro-jejunal anastomosis looked okay.  While I insufflated the stomach pouch with air, Dr. Johna SheriffHoxworth clamped off the efferent limb of the jejunum.  He then flooded the upper abdomen with saline to put the gastric pouch and gastro-jejunal anastomosis under saline.  There was bubbling along the new lesser curve at the anastomosis.  Dr. Johna SheriffHoxworth placed a single suture.  I repeated the insufflation and he put fluid in the upper abdomen and there was no bubbling.  Photos were taken of the anastomosis.  The scope was then withdrawn.  The esophagus was unremarkable and the patient tolerated the endoscopy without difficulty.  Ovidio Kinavid Shade Kaley, MD, [facility] Pager: [phone] Office phone:  [phone]

## 2014-05-21 NOTE — Interval H&P Note (Signed)
History and Physical Interval Note:  05/21/2014 7:11 AM  Philip MesaPaul Mccalla  has presented today for surgery, with the diagnosis of Morbid Obesity  The various methods of treatment have been discussed with the patient and family. After consideration of risks, benefits and other options for treatment, the patient has consented to  Procedure(s): LAPAROSCOPIC ROUX-EN-Y GASTRIC BYPASS WITH UPPER ENDOSCOPY (N/A) as a surgical intervention .  The patient's history has been reviewed, patient examined, no change in status, stable for surgery.  I have reviewed the patient's chart and labs.  Questions were answered to the patient's satisfaction.     Benyamin Jeff T

## 2014-05-21 NOTE — Anesthesia Postprocedure Evaluation (Signed)
  Anesthesia Post-op Note  Patient: Philip Beasley  Procedure(s) Performed: Procedure(s) (LRB): LAPAROSCOPIC ROUX-EN-Y GASTRIC BYPASS WITH UPPER ENDOSCOPY (N/A)  Patient Location: PACU  Anesthesia Type: MAC  Level of Consciousness: awake and alert   Airway and Oxygen Therapy: Patient Spontanous Breathing  Post-op Pain: mild  Post-op Assessment: Post-op Vital signs reviewed, Patient's Cardiovascular Status Stable, Respiratory Function Stable, Patent Airway and No signs of Nausea or vomiting  Last Vitals:  Filed Vitals:   05/21/14 1300  BP: 129/70  Pulse: 60  Temp:   Resp: 12    Post-op Vital Signs: stable   Complications: No apparent anesthesia complications

## 2014-05-21 NOTE — Transfer of Care (Signed)
Immediate Anesthesia Transfer of Care Note  Patient: Philip Beasley  Procedure(s) Performed: Procedure(s): LAPAROSCOPIC ROUX-EN-Y GASTRIC BYPASS WITH UPPER ENDOSCOPY (N/A)  Patient Location: PACU  Anesthesia Type:General  Level of Consciousness: awake, alert , oriented and patient cooperative  Airway & Oxygen Therapy: Patient Spontanous Breathing and Patient connected to face mask oxygen  Post-op Assessment: Report given to PACU RN and Post -op Vital signs reviewed and stable  Post vital signs: Reviewed and stable  Complications: No apparent anesthesia complications

## 2014-05-21 NOTE — Anesthesia Postprocedure Evaluation (Signed)
  Anesthesia Post-op Note  Patient: Philip Beasley  Procedure(s) Performed: Procedure(s) (LRB): LAPAROSCOPIC ROUX-EN-Y GASTRIC BYPASS WITH UPPER ENDOSCOPY (N/A)  Patient Location: PACU  Anesthesia Type: General  Level of Consciousness: awake and alert   Airway and Oxygen Therapy: Patient Spontanous Breathing  Post-op Pain: mild  Post-op Assessment: Post-op Vital signs reviewed, Patient's Cardiovascular Status Stable, Respiratory Function Stable, Patent Airway and No signs of Nausea or vomiting  Last Vitals:  Filed Vitals:   05/21/14 1200  BP: 144/83  Pulse: 56  Temp: 36.6 C  Resp: 15    Post-op Vital Signs: stable   Complications: No apparent anesthesia complications

## 2014-05-22 ENCOUNTER — Encounter (HOSPITAL_COMMUNITY): Payer: Self-pay | Admitting: General Surgery

## 2014-05-22 ENCOUNTER — Inpatient Hospital Stay (HOSPITAL_COMMUNITY): Payer: BC Managed Care – PPO

## 2014-05-22 LAB — COMPREHENSIVE METABOLIC PANEL
ALBUMIN: 3.6 g/dL (ref 3.5–5.2)
ALT: 101 U/L — ABNORMAL HIGH (ref 0–53)
AST: 77 U/L — AB (ref 0–37)
Alkaline Phosphatase: 58 U/L (ref 39–117)
Anion gap: 14 (ref 5–15)
BUN: 13 mg/dL (ref 6–23)
CHLORIDE: 102 meq/L (ref 96–112)
CO2: 23 mEq/L (ref 19–32)
Calcium: 9 mg/dL (ref 8.4–10.5)
Creatinine, Ser: 0.66 mg/dL (ref 0.50–1.35)
GFR calc Af Amer: >90 mL/min (ref >90)
GFR calc non Af Amer: >90 mL/min (ref >90)
Glucose, Bld: 128 mg/dL — ABNORMAL HIGH (ref 70–99)
Potassium: 4.1 mEq/L (ref 3.7–5.3)
Sodium: 139 mEq/L (ref 137–147)
TOTAL PROTEIN: 7 g/dL (ref 6.0–8.3)
Total Bilirubin: 0.5 mg/dL (ref 0.3–1.2)

## 2014-05-22 LAB — CBC WITH DIFFERENTIAL/PLATELET
BASOS ABS: 0 10*3/uL (ref 0.0–0.1)
Basophils Relative: 0 % (ref 0–1)
Eosinophils Absolute: 0 10*3/uL (ref 0.0–0.7)
Eosinophils Relative: 0 % (ref 0–5)
HCT: 38.3 % — ABNORMAL LOW (ref 39.0–52.0)
Hemoglobin: 12.9 g/dL — ABNORMAL LOW (ref 13.0–17.0)
LYMPHS ABS: 1.5 10*3/uL (ref 0.7–4.0)
Lymphocytes Relative: 15 % (ref 12–46)
MCH: 30 pg (ref 26.0–34.0)
MCHC: 33.7 g/dL (ref 30.0–36.0)
MCV: 89.1 fL (ref 78.0–100.0)
MONOS PCT: 10 % (ref 3–12)
Monocytes Absolute: 1 10*3/uL (ref 0.1–1.0)
Neutro Abs: 7.2 10*3/uL (ref 1.7–7.7)
Neutrophils Relative %: 75 % (ref 43–77)
PLATELETS: 195 10*3/uL (ref 150–400)
RBC: 4.3 MIL/uL (ref 4.22–5.81)
RDW: 13.4 % (ref 11.5–15.5)
WBC: 9.7 10*3/uL (ref 4.0–10.5)

## 2014-05-22 LAB — HEMOGLOBIN AND HEMATOCRIT, BLOOD
HEMATOCRIT: 39.1 % (ref 39.0–52.0)
Hemoglobin: 13.4 g/dL (ref 13.0–17.0)

## 2014-05-22 MED ORDER — IOHEXOL 300 MG/ML  SOLN
50.0000 mL | Freq: Once | INTRAMUSCULAR | Status: AC | PRN
  Administered 2014-05-22: 40 mL via ORAL

## 2014-05-22 NOTE — Progress Notes (Signed)
Patient ID: Philip Beasley, male   DOB: [birth year], 48 y.o.   MRN: [id number] 1 Day Post-Op  Subjective: No C/O currently.  Mild epigastric pain relieved with meds.  Denies nausea, ambulatory  Objective: Vital signs in last 24 hours: Temp:  [97.5 F (36.4 C)-98.9 F (37.2 C)] 98.2 F (36.8 C) (12/01 0959) Pulse Rate:  [45-78] 45 (12/01 0959) Resp:  [9-23] 18 (12/01 0959) BP: (114-151)/(62-90) 119/72 mmHg (12/01 0959) SpO2:  [93 %-100 %] 95 % (12/01 0959) Last BM Date: 05/21/14  Intake/Output from previous day: 11/30 0701 - 12/01 0700 In: 3350 [I.V.:3250; IV Piggyback:100] Out: 1075 [Urine:975; Blood:100] Intake/Output this shift:    General appearance: alert, cooperative and no distress GI: normal findings: soft, non-tender Incision/Wound: Clean and dry  Lab Results:   Recent Labs  05/21/14 1807 05/22/14 0420  WBC  --  9.7  HGB 14.0 12.9*  HCT 41.2 38.3*  PLT  --  195   BMET  Recent Labs  05/22/14 0420  NA 139  K 4.1  CL 102  CO2 23  GLUCOSE 128*  BUN 13  CREATININE 0.66  CALCIUM 9.0     Studies/Results: Dg Ugi W/water Sol Cm  05/22/2014   CLINICAL DATA:  Status post Roux-en-Y gastric bypass.  EXAM: WATER SOLUBLE UPPER GI SERIES  TECHNIQUE: Single-column upper GI series was performed using water soluble contrast.  CONTRAST:  40mL OMNIPAQUE IOHEXOL 300 MG/ML  SOLN  COMPARISON:  None.  FLUOROSCOPY TIME:  1 min 24 seconds  FINDINGS: Scout KUB demonstrates suture material in the left upper quadrant and gas in grossly nondilated loops of small large bowel.  The patient drank contrast without difficulty. Contrast passed readily from the esophagus into the gastric pouch. Contrast passed from the pouch into the small bowel, however there was prominent narrowing at the gastrojejunostomy which mildly delayed emptying of the gastric pouch. There is no contrast extravasation seen to suggest leak. Contrast opacified the Roux limb and progressed distally without evidence of  obstruction.  IMPRESSION: Status post Roux-en-Y gastric bypass without evidence of leak. Prominent narrowing at the gastrojejunostomy, possibly reflecting postoperative edema.   Electronically Signed   By: Sebastian AcheAllen  Grady   On: 05/22/2014 09:46    Anti-infectives: Anti-infectives    Start     Dose/Rate Route Frequency Ordered Stop   05/21/14 0608  cefOXitin (MEFOXIN) 2 g in dextrose 5 % 50 mL IVPB     2 g100 mL/hr over 30 Minutes Intravenous On call to O.R. 05/21/14 [id number] 05/21/14 0938      Assessment/Plan: s/p Procedure(s): LAPAROSCOPIC ROUX-EN-Y GASTRIC BYPASS WITH UPPER ENDOSCOPY Doing well without apparent complication Start POD 1 diet.   LOS: 1 day    Reeve Mallo T 05/22/2014

## 2014-05-22 NOTE — Care Management Note (Signed)
    Page 1 of 1   05/22/2014     11:59:02 AM CARE MANAGEMENT NOTE 05/22/2014  Patient:  Philip Beasley,Philip Beasley   Account Number:  [id number]  Date Initiated:  05/22/2014  Documentation initiated by:  Lorenda IshiharaPEELE,Omarion Minnehan  Subjective/Objective Assessment:   48 yo male admitted s/p gastric bypass. PTA lived at home with spouse.     Action/Plan:   Home when stable   Anticipated DC Date:  05/22/2014   Anticipated DC Plan:  HOME/SELF CARE      DC Planning Services  CM consult      Choice offered to / List presented to:             Status of service:  Completed, signed off Medicare Important Message given?   (If response is "NO", the following Medicare IM given date fields will be blank) Date Medicare IM given:   Medicare IM given by:   Date Additional Medicare IM given:   Additional Medicare IM given by:    Discharge Disposition:  HOME/SELF CARE  Per UR Regulation:  Reviewed for med. necessity/level of care/duration of stay  If discussed at Long Length of Stay Meetings, dates discussed:    Comments:

## 2014-05-22 NOTE — Plan of Care (Signed)
Problem: Food- and Nutrition-Related Knowledge Deficit (NB-1.1) Goal: Nutrition education Formal process to instruct or train a patient/client in a skill or to impart knowledge to help patients/clients voluntarily manage or modify food choices and eating behavior to maintain or improve health.  Outcome: Completed/Met Date Met:  05/22/14 Nutrition Education Note  Received consult for diet education per DROP protocol.   11/30 s/p Procedure(s) (LRB): LAPAROSCOPIC ROUX-EN-Y GASTRIC BYPASS WITH UPPER ENDOSCOPY   Discussed 2 week post op diet with pt. Emphasized that liquids must be non carbonated, non caffeinated, and sugar free. Fluid goals discussed. Pt to follow up with outpatient bariatric RD for further diet progression after 2 weeks. Multivitamins and minerals also reviewed. Teach back method used, pt expressed understanding, expect good compliance.   Diet: First 2 Weeks  You will see the nutritionist about two (2) weeks after your surgery. The nutritionist will increase the types of foods you can eat if you are handling liquids well:  If you have severe vomiting or nausea and cannot handle clear liquids lasting longer than 1 day, call your surgeon  Protein Shake  Drink at least 2 ounces of shake 5-6 times per day  Each serving of protein shakes (usually 8 - 12 ounces) should have a minimum of:  15 grams of protein  And no more than 5 grams of carbohydrate  Goal for protein each day:  Men = 80 grams per day  Women = 60 grams per day  Protein powder may be added to fluids such as non-fat milk or Lactaid milk or Soy milk (limit to 35 grams added protein powder per serving)   Hydration  Slowly increase the amount of water and other clear liquids as tolerated (See Acceptable Fluids)  Slowly increase the amount of protein shake as tolerated  Sip fluids slowly and throughout the day  May use sugar substitutes in small amounts (no more than 6 - 8 packets per day; i.e. Splenda)   Fluid Goal   The first goal is to drink at least 8 ounces of protein shake/drink per day (or as directed by the nutritionist); some examples of protein shakes are [employer], [employer], EAS Edge HP, and Unjury. See handout from pre-op Bariatric Education Class:  Slowly increase the amount of protein shake you drink as tolerated  You may find it easier to slowly sip shakes throughout the day  It is important to get your proteins in first  Your fluid goal is to drink 64 - 100 ounces of fluid daily  It may take a few weeks to build up to this  32 oz (or more) should be clear liquids  And  32 oz (or more) should be full liquids (see below for examples)  Liquids should not contain sugar, caffeine, or carbonation   Clear Liquids:  Water or Sugar-free flavored water (i.e. Fruit H2O, Propel)  Decaffeinated coffee or tea (sugar-free)  Crystal Lite, Wyler's Lite, Minute Maid Lite  Sugar-free Jell-O  Bouillon or broth  Sugar-free Popsicle: *Less than 20 calories each; Limit 1 per day   Full Liquids:  Protein Shakes/Drinks + 2 choices per day of other full liquids  Full liquids must be:  No More Than 12 grams of Carbs per serving  No More Than 3 grams of Fat per serving  Strained low-fat cream soup  Non-Fat milk  Fat-free Lactaid Milk  Sugar-free yogurt (Dannon Lite & Fit, Greek yogurt)     Clayton Bibles, MS, RD, LDN Pager: [phone] After Hours Pager:  [phone]

## 2014-05-22 NOTE — Plan of Care (Signed)
Problem: Phase I Progression Outcomes Goal: Voiding-avoid urinary catheter unless indicated Outcome: Completed/Met Date Met:  05/22/14     

## 2014-05-22 NOTE — Plan of Care (Signed)
Problem: Phase I Progression Outcomes Goal: OOB as tolerated unless otherwise ordered Outcome: Completed/Met Date Met:  05/22/14     

## 2014-05-22 NOTE — Plan of Care (Signed)
Problem: Phase I Progression Outcomes Goal: Initial discharge plan identified Outcome: Completed/Met Date Met:  05/22/14 Goal: Operative site clean or minimal drainage Outcome: Completed/Met Date Met:  05/22/14

## 2014-05-22 NOTE — Progress Notes (Signed)
Patient alert and oriented, Post op day 1.  Provided support and encouragement.  Encouraged pulmonary toilet, ambulation and small sips of liquids.  All questions answered.  Will continue to monitor. 

## 2014-05-22 NOTE — Plan of Care (Signed)
Problem: Phase I Progression Outcomes Goal: Pain controlled with appropriate interventions Outcome: Completed/Met Date Met:  05/22/14     

## 2014-05-23 LAB — CBC WITH DIFFERENTIAL/PLATELET
Basophils Absolute: 0 10*3/uL (ref 0.0–0.1)
Basophils Relative: 0 % (ref 0–1)
Eosinophils Absolute: 0.1 10*3/uL (ref 0.0–0.7)
Eosinophils Relative: 1 % (ref 0–5)
HCT: 37.3 % — ABNORMAL LOW (ref 39.0–52.0)
HEMOGLOBIN: 12.4 g/dL — AB (ref 13.0–17.0)
LYMPHS ABS: 2.6 10*3/uL (ref 0.7–4.0)
Lymphocytes Relative: 32 % (ref 12–46)
MCH: 30.4 pg (ref 26.0–34.0)
MCHC: 33.2 g/dL (ref 30.0–36.0)
MCV: 91.4 fL (ref 78.0–100.0)
MONOS PCT: 9 % (ref 3–12)
Monocytes Absolute: 0.8 10*3/uL (ref 0.1–1.0)
NEUTROS ABS: 4.8 10*3/uL (ref 1.7–7.7)
NEUTROS PCT: 58 % (ref 43–77)
Platelets: 162 10*3/uL (ref 150–400)
RBC: 4.08 MIL/uL — AB (ref 4.22–5.81)
RDW: 13.5 % (ref 11.5–15.5)
WBC: 8.2 10*3/uL (ref 4.0–10.5)

## 2014-05-23 MED ORDER — METFORMIN HCL 500 MG PO TABS: ORAL_TABLET | ORAL | Status: AC

## 2014-05-23 NOTE — Discharge Summary (Signed)
   Patient ID: Philip Beasley [id number] 48 y.o. December 30, 1965  05/21/2014  Discharge date and time: 05/23/2014   Admitting Physician: Glenna FellowsHOXWORTH,Emmalynne Courtney T  Discharge Physician: Glenna FellowsHOXWORTH,Jacoby Ritsema T  Admission Diagnoses: Morbid Obesity  Discharge Diagnoses: Same  Operations: Procedure(s): LAPAROSCOPIC ROUX-EN-Y GASTRIC BYPASS WITH UPPER ENDOSCOPY  Admission Condition: good  Discharged Condition: good  Indication for Admission: Patient is a 48 year old male with progressive morbid obesity unresponsive to medical management who presents with a BMI of 40 with comorbidities of adult-onset diabetes mellitus oral agent controlled, sleep apnea, hypertension and joint pain. After extensive preoperative workup and discussion detailed elsewhere we have elected to proceed with laparoscopic Roux-en-Y gastric bypass for treatment of his morbid obesity.  Hospital Course: On the morning of admission the patient underwent an uneventful laparoscopic Roux-en-Y gastric bypass. His postoperative course was uncomplicated. On the first postoperative day Gastrografin swallow showed no leak or obstruction. Abdomen was benign and lab work and vital signs unremarkable. He was started on water by mouth. On the second postoperative day he has minimal pain. Ambulatory. Lab work is unremarkable. Tolerating protein shakes. Abdomen is soft and nontender and wounds healing well. He is felt ready for discharge.   Disposition: Home  Patient Instructions:    Medication List    STOP taking these medications        aspirin-acetaminophen-caffeine 250-250-65 MG per tablet  Commonly known as:  EXCEDRIN MIGRAINE     methylPREDNIsolone 4 MG tablet  Commonly known as:  MEDROL DOSPACK     naproxen 500 MG tablet  Commonly known as:  NAPROSYN      TAKE these medications        acetaminophen 500 MG tablet  Commonly known as:  TYLENOL  Take 500 mg by mouth every 6 (six) hours as needed for moderate pain.     acyclovir  400 MG tablet  Commonly known as:  ZOVIRAX  Take 2 tablets (800 mg total) by mouth 3 (three) times daily.     aspirin 81 MG chewable tablet  Chew 1 tablet (81 mg total) by mouth daily.     atorvastatin 20 MG tablet  Commonly known as:  LIPITOR  Take 1 tablet (20 mg total) by mouth daily at 6 PM.     hydroxypropyl cellulose 5 MG Inst  Place 5 mg into the left eye daily.     lisinopril-hydrochlorothiazide 10-12.5 MG per tablet  Commonly known as:  PRINZIDE,ZESTORETIC  Take 1 tablet by mouth every morning.     metFORMIN 500 MG tablet  Commonly known as:  GLUCOPHAGE  Take 1 tabs (500mg ) by mouth twice daily     multivitamin with minerals Tabs tablet  Take 1 tablet by mouth every morning.     pramipexole 0.25 MG tablet  Commonly known as:  MIRAPEX  Take 1 tablet (0.25 mg total) by mouth at bedtime.        Activity: activity as tolerated Diet: bariatric protein shakes as instructed Wound Care: none needed  Follow-up:  With Dr. Johna SheriffHoxworth in 3 weeks.  Signed: Mariella SaaBenjamin T Valentin Benney MD, FACS  05/23/2014, 8:15 AM

## 2014-05-23 NOTE — Progress Notes (Signed)
Nurse reviewed discharge instructions with pt.  Pt verbalized understanding of discharge instructions, follow up appointments and new medications.  No concerns at time of discharge. 

## 2014-05-23 NOTE — Progress Notes (Signed)
Patient alert and oriented, pain is controlled. Patient is tolerating fluids,  advanced to protein shake today, patient tolerated well. Reviewed Gastric Bypass discharge instructions with patient and patient is able to articulate understanding. Provided information on BELT program, Support Group and [facility] outpatient pharmacy. All questions answered, will continue to monitor.    

## 2014-05-23 NOTE — Discharge Instructions (Addendum)
Per bariatric nurse ° ° ° °GASTRIC BYPASS/SLEEVE ° Home Care Instructions ° ° These instructions are to help you care for yourself when you go home. ° °Call: If you have any problems. °• Call [phone] and ask for the surgeon on call °• If you need immediate assistance come to the ER at [facility]. Tell the ER staff you are a new post-op gastric bypass or gastric sleeve patient  °Signs and symptoms to report: • Severe  vomiting or nausea °o If you cannot handle clear liquids for longer than 1 day, call your surgeon °• Abdominal pain which does not get better after taking your pain medication °• Fever greater than 100.4°  F and chills °• Heart rate over 100 beats a minute °• Trouble breathing °• Chest pain °• Redness,  swelling, drainage, or foul odor at incision (surgical) sites °• If your incisions open or pull apart °• Swelling or pain in calf (lower leg) °• Diarrhea (Loose bowel movements that happen often), frequent watery, uncontrolled bowel movements °• Constipation, (no bowel movements for 3 days) if this happens: °o Take Milk of Magnesia, 2 tablespoons by mouth, 3 times a day for 2 days if needed °o Stop taking Milk of Magnesia once you have had a bowel movement °o Call your doctor if constipation continues °Or °o Take Miralax  (instead of Milk of Magnesia) following the label instructions °o Stop taking Miralax once you have had a bowel movement °o Call your doctor if constipation continues °• Anything you think is “abnormal for you” °  °Normal side effects after surgery: • Unable to sleep at night or unable to concentrate °• Irritability °• Being tearful (crying) or depressed ° °These are common complaints, possibly related to your anesthesia, stress of surgery, and change in lifestyle, that usually go away a few weeks after surgery. If these feelings continue, call your medical doctor.  °Wound Care: You may have surgical glue, steri-strips, or staples over your incisions after surgery °• Surgical  glue: Looks like clear film over your incisions and will wear off a little at a time °• Steri-strips: Adhesive strips of tape over your incisions. You may notice a yellowish color on skin under the steri-strips. This is used to make the steri-strips stick better. Do not pull the steri-strips off - let them fall off °• Staples: Staples may be removed before you leave the hospital °o If you go home with staples, call [facility] for an appointment with your surgeon’s nurse to have staples removed 10 days after surgery, [phone] °• Showering: You may shower two (2) days after your surgery unless your surgeon tells you differently °o Wash gently around incisions with warm soapy water, rinse well, and gently pat dry °o If you have a drain (tube from your incision), you may need someone to hold this while you shower °o No tub baths until staples are removed and incisions are healed °  °Medications: • Medications should be liquid or crushed if larger than the size of a dime °• Extended release pills (medication that releases a little bit at a time through the  day) should not be crushed °• Depending on the size and number of medications you take, you may need to space (take a few throughout the day)/change the time you take your medications so that you do not over-fill your pouch (smaller stomach) °• Make sure you follow-up with you primary care physician to make medication changes needed during rapid weight loss and   life -style changes °• If you have diabetes, follow up with your doctor that orders your diabetes medication(s) within one week after surgery and check your blood sugar regularly ° °• Do not drive while taking narcotics (pain medications) ° °• Do not take acetaminophen (Tylenol) and Roxicet or Lortab Elixir at the same time since these pain medications contain acetaminophen °  °Diet:  °First 2 Weeks You will see the nutritionist about two (2) weeks after your surgery. The nutritionist will  increase the types of foods you can eat if you are handling liquids well: °• If you have severe vomiting or nausea and cannot handle clear liquids lasting longer than 1 day call your surgeon °Protein Shake °• Drink at least 2 ounces of shake 5-6 times per day °• Each serving of protein shakes (usually 8-12 ounces) should have a minimum of: °o 15 grams of protein °o And no more than 5 grams of carbohydrate °• Goal for protein each day: °o Men = 80 grams per day °o Women = 60 grams per day °  ° • Protein powder may be added to fluids such as non-fat milk or Lactaid milk or Soy milk (limit to 35 grams added protein powder per serving) ° °Hydration °• Slowly increase the amount of water and other clear liquids as tolerated (See Acceptable Fluids) °• Slowly increase the amount of protein shake as tolerated °• Sip fluids slowly and throughout the day °• May use sugar substitutes in small amounts (no more than 6-8 packets per day; i.e. Splenda) ° °Fluid Goal °• The first goal is to drink at least 8 ounces of protein shake/drink per day (or as directed by the nutritionist); some examples of protein shakes are Syntrax Nectar, Adkins Advantage, EAS Edge HP, and Unjury. - See handout from pre-op Bariatric Education Class: °o Slowly increase the amount of protein shake you drink as tolerated °o You may find it easier to slowly sip shakes throughout the day °o It is important to get your proteins in first °• Your fluid goal is to drink 64-100 ounces of fluid daily °o It may take a few weeks to build up to this  °• 32 oz. (or more) should be clear liquids °And °• 32 oz. (or more) should be full liquids (see below for examples) °• Liquids should not contain sugar, caffeine, or carbonation ° °Clear Liquids: °• Water of Sugar-free flavored water (i.e. Fruit H²O, Propel) °• Decaffeinated coffee or tea (sugar-free) °• Crystal lite, Wyler’s Lite, Minute Maid Lite °• Sugar-free Jell-O °• Bouillon or broth °• Sugar-free Popsicle:    -  Less than 20 calories each; Limit 1 per day ° °Full Liquids: °                  Protein Shakes/Drinks + 2 choices per day of other full liquids °• Full liquids must be: °o No More Than 12 grams of Carbs per serving °o No More Than 3 grams of Fat per serving °• Strained low-fat cream soup °• Non-Fat milk °• Fat-free Lactaid Milk °• Sugar-free yogurt (Dannon Lite & Fit, Greek yogurt) ° °  °Vitamins and Minerals • Start 1 day after surgery unless otherwise directed by your surgeon °• 2 Chewable Multivitamin / Multimineral Supplement with iron (i.e. Centrum for Adults) °• Vitamin B-12, 350-500 micrograms sub-lingual (place tablet under the tongue) each day °• Chewable Calcium Citrate with Vitamin D-3 °(Example: 3 Chewable Calcium  Plus 600 with Vitamin D-3) °o Take 500 mg three (3)   times a day for a total of 1500 mg each day °o Do not take all 3 doses of calcium at one time as it may cause constipation, and you can only absorb 500 mg at a time °o Do not mix multivitamins containing iron with calcium supplements;  take 2 hours apart °o Do not substitute Tums (calcium carbonate) for your calcium °• Menstruating women and those at risk for anemia ( a blood disease that causes weakness) may need extra iron °o Talk to your doctor to see if you need more iron °• If you need extra iron: Total daily Iron recommendation (including Vitamins) is 50 to 100 mg Iron/day °• Do not stop taking or change any vitamins or minerals until you talk to your nutritionist or surgeon °• Your nutritionist and/or surgeon must approve all vitamin and mineral supplements °  °Activity and Exercise: It is important to continue walking at home. Limit your physical activity as instructed by your doctor. During this time, use these guidelines: °• Do not lift anything greater than ten  (10) pounds for at least two (2) weeks °• Do not go back to work or drive until your surgeon says you can °• You may have sex when you feel comfortable °o It is VERY  important for male patients to use a reliable birth control method; fertility often increase after surgery °o Do not get pregnant for at least 18 months °• Start exercising as soon as your doctor tells you that you can °o Make sure your doctor approves any physical activity °• Start with a simple walking program °• Walk 5-15 minutes each day, 7 days per week °• Slowly increase until you are walking 30-45 minutes per day °• Consider joining our BELT program. [phone] or email [email or web address] °  °Special Instructions Things to remember: °• Free counseling is available for you and your family through collaboration between [facility] and [employer]. Please call [phone] and leave a message °• Use your CPAP when sleeping if this applies to you °• Consider buying a medical alert bracelet that says you had lap-band surgery °  °  You will likely have your first fill (fluid added to your band) 6 - 8 weeks after surgery °• [facility] has a free Bariatric Surgery Support Group that meets monthly, the 3rd Thursday, 6pm. [facility] Classrooms. You can see classes online at [email or web address] °• It is very important to keep all follow up appointments with your surgeon, nutritionist, primary care physician, and behavioral health practitioner °o After the first year, please follow up with your bariatric surgeon and nutritionist at least once a year in order to maintain best weight loss results °      °             [facility]:  [phone] ° °             [facility]: [phone] ° °             Bariatric Nurse Coordinator: [phone]  °Gastric Bypass/Sleeve Home Care Instructions  Rev. 07/2012    ° °                                                    Reviewed and Endorsed °                                                     by [facility] Patient Education Committee, Jan, 2014 ° ° ° ° ° ° ° ° ° °

## 2014-05-23 NOTE — Plan of Care (Signed)
Problem: Discharge Progression Outcomes Goal: Pain controlled with appropriate interventions Outcome: Completed/Met Date Met:  05/23/14     

## 2014-05-25 ENCOUNTER — Telehealth (HOSPITAL_COMMUNITY): Payer: Self-pay

## 2014-05-25 NOTE — Telephone Encounter (Signed)

## 2014-06-05 ENCOUNTER — Ambulatory Visit: Payer: BC Managed Care – PPO

## 2014-06-05 ENCOUNTER — Encounter: Payer: BC Managed Care – PPO | Attending: General Surgery

## 2014-06-05 DIAGNOSIS — Z713 Dietary counseling and surveillance: Secondary | ICD-10-CM | POA: Insufficient documentation

## 2014-06-05 DIAGNOSIS — Z6838 Body mass index (BMI) 38.0-38.9, adult: Secondary | ICD-10-CM | POA: Diagnosis not present

## 2014-06-05 NOTE — Progress Notes (Signed)
Bariatric Class:  Appt start time: 1530 end time:  1630.  2 Week Post-Operative Nutrition Class  Patient was seen on 06/05/14 for Post[facility]ative [facility]   Surgery date: 05/21/14 Surgery type: RYGB Start weight at [facility]: 313 lbs on 03/03/14 Weight today: 285 lbs  Weight change: 42 lbs  TANITA  BODY COMP RESULTS  04/30/14 06/05/14   BMI (kg/m^2) 43.1 37.6   Fat Mass (lbs) 164 109.0   Fat Free Mass (lbs) 163 176.0   Total Body Water (lbs) 119.5 129.0    The following the learning objectives were met by the patient during this course:  Identifies Phase 3A (Soft, High Proteins) Dietary Goals and will begin from 2 weeks post-operatively to 2 months post-operatively  Identifies appropriate sources of fluids and proteins   States protein recommendations and appropriate sources post-operatively  Identifies the need for appropriate texture modifications, mastication, and bite sizes when consuming solids  Identifies appropriate multivitamin and calcium sources post-operatively  Describes the need for physical activity post-operatively and will follow MD recommendations  States when to call healthcare provider regarding medication questions or post-operative complications  Handouts given during class include:  Phase 3A: Soft, High Protein Diet Handout  Follow-Up Plan: Patient will follow-up at [facility] in 6 weeks for 2 month post-op nutrition visit for diet advancement per MD.

## 2014-07-16 ENCOUNTER — Ambulatory Visit: Payer: BC Managed Care – PPO | Admitting: Dietician

## 2014-07-18 ENCOUNTER — Encounter: Payer: 59 | Attending: General Surgery | Admitting: Dietician

## 2014-07-18 DIAGNOSIS — Z713 Dietary counseling and surveillance: Secondary | ICD-10-CM | POA: Insufficient documentation

## 2014-07-18 DIAGNOSIS — Z6838 Body mass index (BMI) 38.0-38.9, adult: Secondary | ICD-10-CM | POA: Diagnosis not present

## 2014-07-18 NOTE — Patient Instructions (Signed)
Goals:  Follow Phase 3B: High Protein + Non-Starchy Vegetables  Eat 3-6 small meals/snacks, every 3-5 hrs  Increase lean protein foods to meet 80g goal  Increase fluid intake to 64oz + - carry water bottle with you and try to sip frequently  Avoid drinking 15 minutes before, during and 30 minutes after eating  Aim for >30 min of physical activity daily

## 2014-07-18 NOTE — Progress Notes (Signed)
  Follow-up visit: 8 Weeks Post-Operative RYGB Surgery  Medical Nutrition Therapy:  Appt start time: 0730 end time:  0745.  Primary concerns today: Post-operative Bariatric Surgery Nutrition Management. Retun with a 23 lb weight loss since post op class.  Overall doing well, tolerating food well.  Surgery date: 05/21/14 Surgery type: RYGB [facility]: 313 lbs on 03/03/14 Weight today: 262.0 lbs  Weight change: 23 lbs Total weight loss: 65 lbs  TANITA  BODY COMP RESULTS  04/30/14 06/05/14 07/18/14   BMI (kg/m^2) 43.1 37.6 34.6   Fat Mass (lbs) 164 109.0 101.5   Fat Free Mass (lbs) 163 176.0 160.5   Total Body Water (lbs) 119.5 129.0 117.5    Preferred Learning Style:   No preference indicated   Learning Readiness:   Ready  24-hr recall: B (AM): protein shake premier (30 g) Snk (AM): none usually, might have string cheese L (PM): 3-4 oz hamburger or chicken patty (21-30 g) Snk (PM): protein shake premier (30 g) D (PM): veggie burger or vegetarian ham (12 g) Snk (PM):none  Fluid intake: 22 oz protein, 20-40 oz water  (42-62 oz) Estimated total protein intake: ~90 g  Medications: see list Supplementation: taking  CBG monitoring: test 1 x week Average CBG per patient: 95 mg/dl Last patient reported [id number]: "6-something" after surgery  Using straws: No Drinking while eating: No Hair loss: No Carbonated beverages: No N/V/D/C: No Dumping syndrome: No  Recent physical activity:  Job is very active - maintenance  Progress Towards Goal(s):  In progress.  Handouts given during visit include:  Phase 3B High Protein + Non Starchy Vegetables   Nutritional Diagnosis:  [place]-3.3 Overweight/obesity related to past poor dietary habits and physical inactivity as evidenced by patient w/ recent RYGB surgery following dietary guidelines for continued weight loss.    Intervention:  Nutrition education/diet advancement.  Teaching Method Utilized:  Visual Auditory Hands  on  Barriers to learning/adherence to lifestyle change: none  Demonstrated degree of understanding via:  Teach Back   Monitoring/Evaluation:  Dietary intake, exercise,and body weight. Follow up in 1 months for 3 month post-op visit.

## 2014-08-20 ENCOUNTER — Ambulatory Visit: Payer: Self-pay | Admitting: Dietician

## 2014-12-17 ENCOUNTER — Other Ambulatory Visit: Payer: Self-pay

## 2015-10-18 DIAGNOSIS — M545 Low back pain: Secondary | ICD-10-CM | POA: Diagnosis not present

## 2015-10-18 DIAGNOSIS — M7581 Other shoulder lesions, right shoulder: Secondary | ICD-10-CM | POA: Diagnosis not present

## 2015-10-18 DIAGNOSIS — M25552 Pain in left hip: Secondary | ICD-10-CM | POA: Diagnosis not present

## 2015-10-31 DIAGNOSIS — R202 Paresthesia of skin: Secondary | ICD-10-CM | POA: Diagnosis not present

## 2015-10-31 DIAGNOSIS — G2581 Restless legs syndrome: Secondary | ICD-10-CM | POA: Diagnosis not present

## 2015-10-31 DIAGNOSIS — Z6829 Body mass index (BMI) 29.0-29.9, adult: Secondary | ICD-10-CM | POA: Diagnosis not present

## 2015-10-31 DIAGNOSIS — G629 Polyneuropathy, unspecified: Secondary | ICD-10-CM | POA: Diagnosis not present

## 2015-11-20 DIAGNOSIS — Z125 Encounter for screening for malignant neoplasm of prostate: Secondary | ICD-10-CM | POA: Diagnosis not present

## 2015-11-20 DIAGNOSIS — Z0000 Encounter for general adult medical examination without abnormal findings: Secondary | ICD-10-CM | POA: Diagnosis not present

## 2015-11-20 DIAGNOSIS — E119 Type 2 diabetes mellitus without complications: Secondary | ICD-10-CM | POA: Diagnosis not present

## 2015-11-26 DIAGNOSIS — Z1389 Encounter for screening for other disorder: Secondary | ICD-10-CM | POA: Diagnosis not present

## 2015-11-26 DIAGNOSIS — G629 Polyneuropathy, unspecified: Secondary | ICD-10-CM | POA: Diagnosis not present

## 2015-11-26 DIAGNOSIS — Z6829 Body mass index (BMI) 29.0-29.9, adult: Secondary | ICD-10-CM | POA: Diagnosis not present

## 2015-11-26 DIAGNOSIS — G2581 Restless legs syndrome: Secondary | ICD-10-CM | POA: Diagnosis not present

## 2015-11-26 DIAGNOSIS — Z0000 Encounter for general adult medical examination without abnormal findings: Secondary | ICD-10-CM | POA: Diagnosis not present

## 2015-12-09 DIAGNOSIS — Z1212 Encounter for screening for malignant neoplasm of rectum: Secondary | ICD-10-CM | POA: Diagnosis not present

## 2016-01-02 ENCOUNTER — Encounter: Payer: Self-pay | Admitting: Internal Medicine

## 2016-02-01 IMAGING — CR DG CHEST 2V
2 series · 2 of 2 positions shown · non-contrast
Comparison: Single view of the chest 02/09/2006 CT chest
07/01/2004.

CLINICAL DATA: Altered mental status.

EXAM:
CHEST  2 VIEW

[w chest pa]
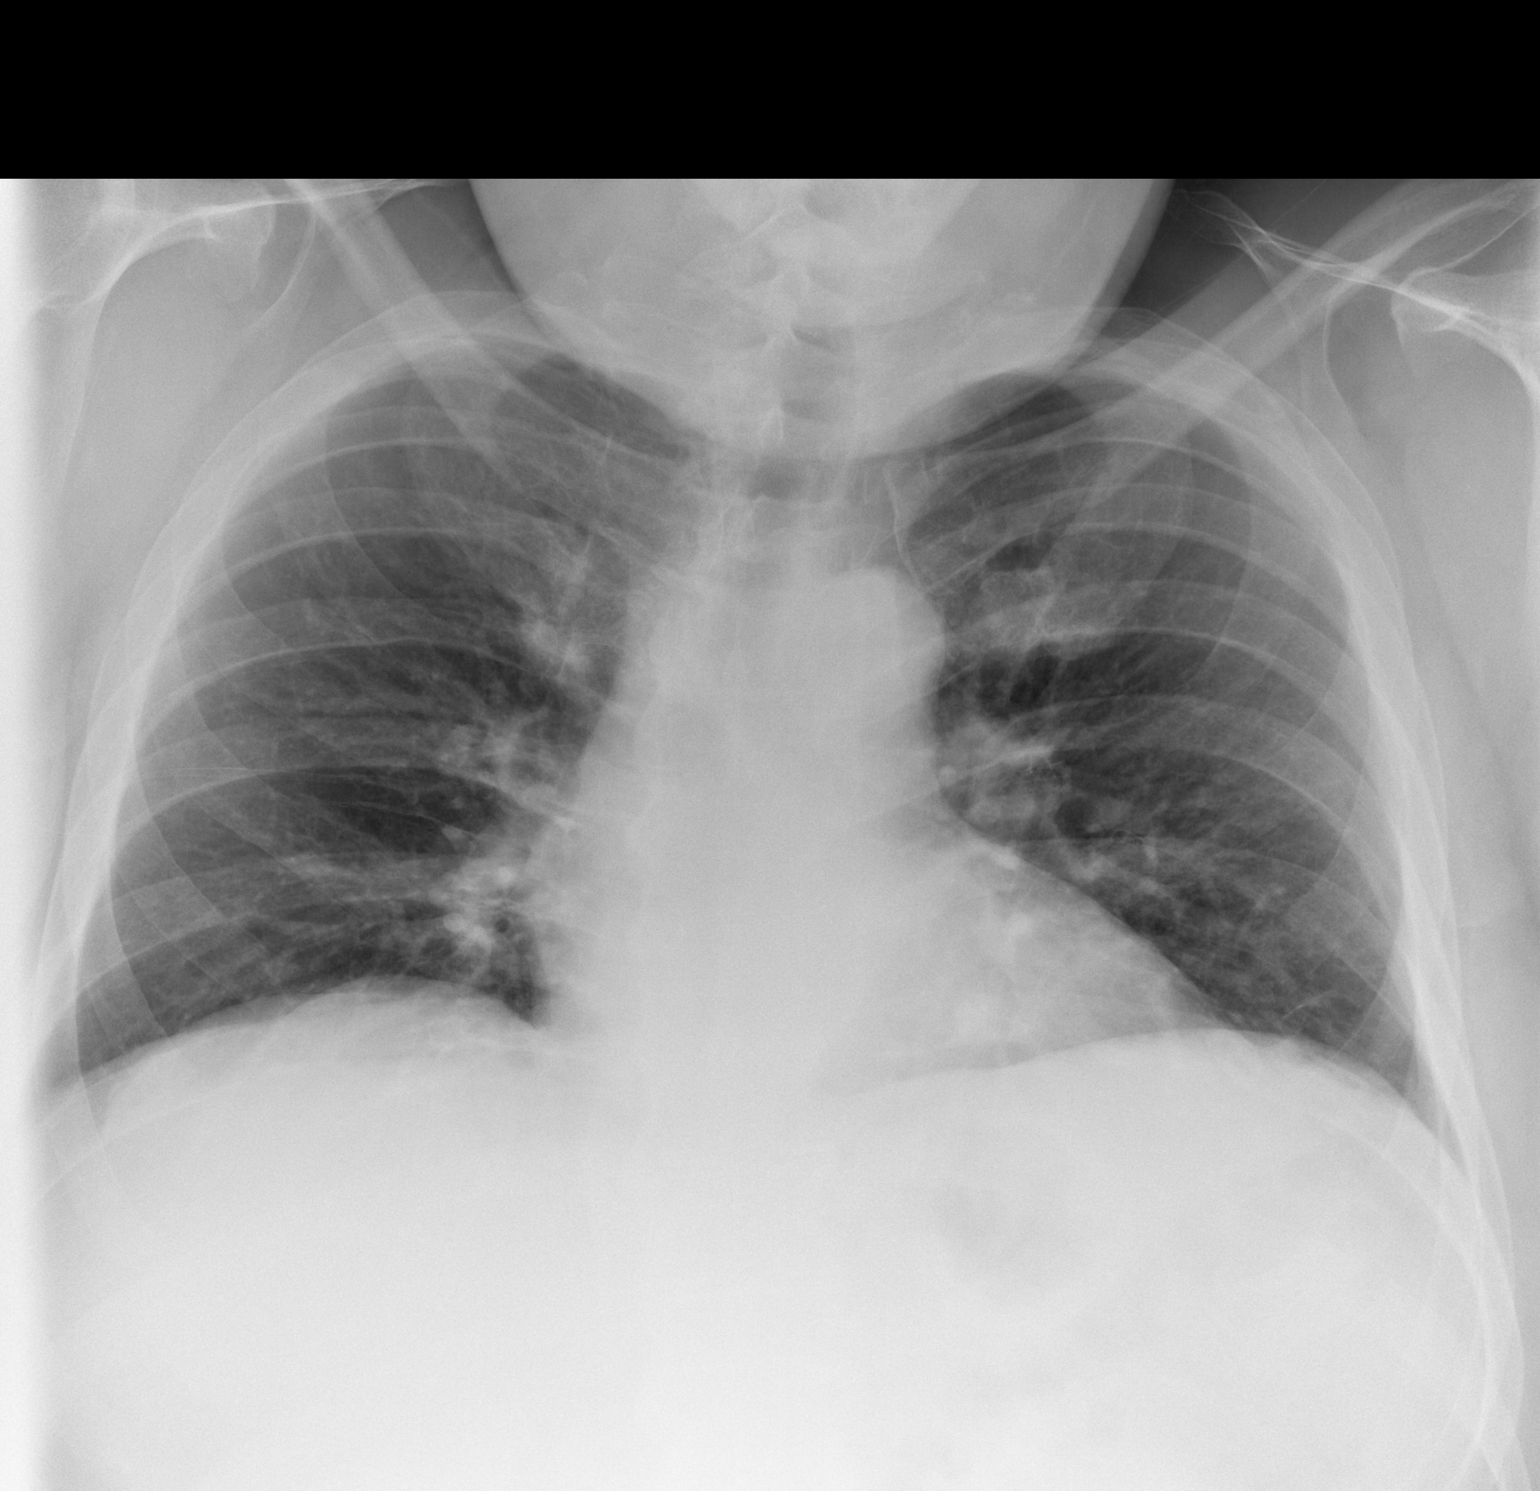

[w chest lat]
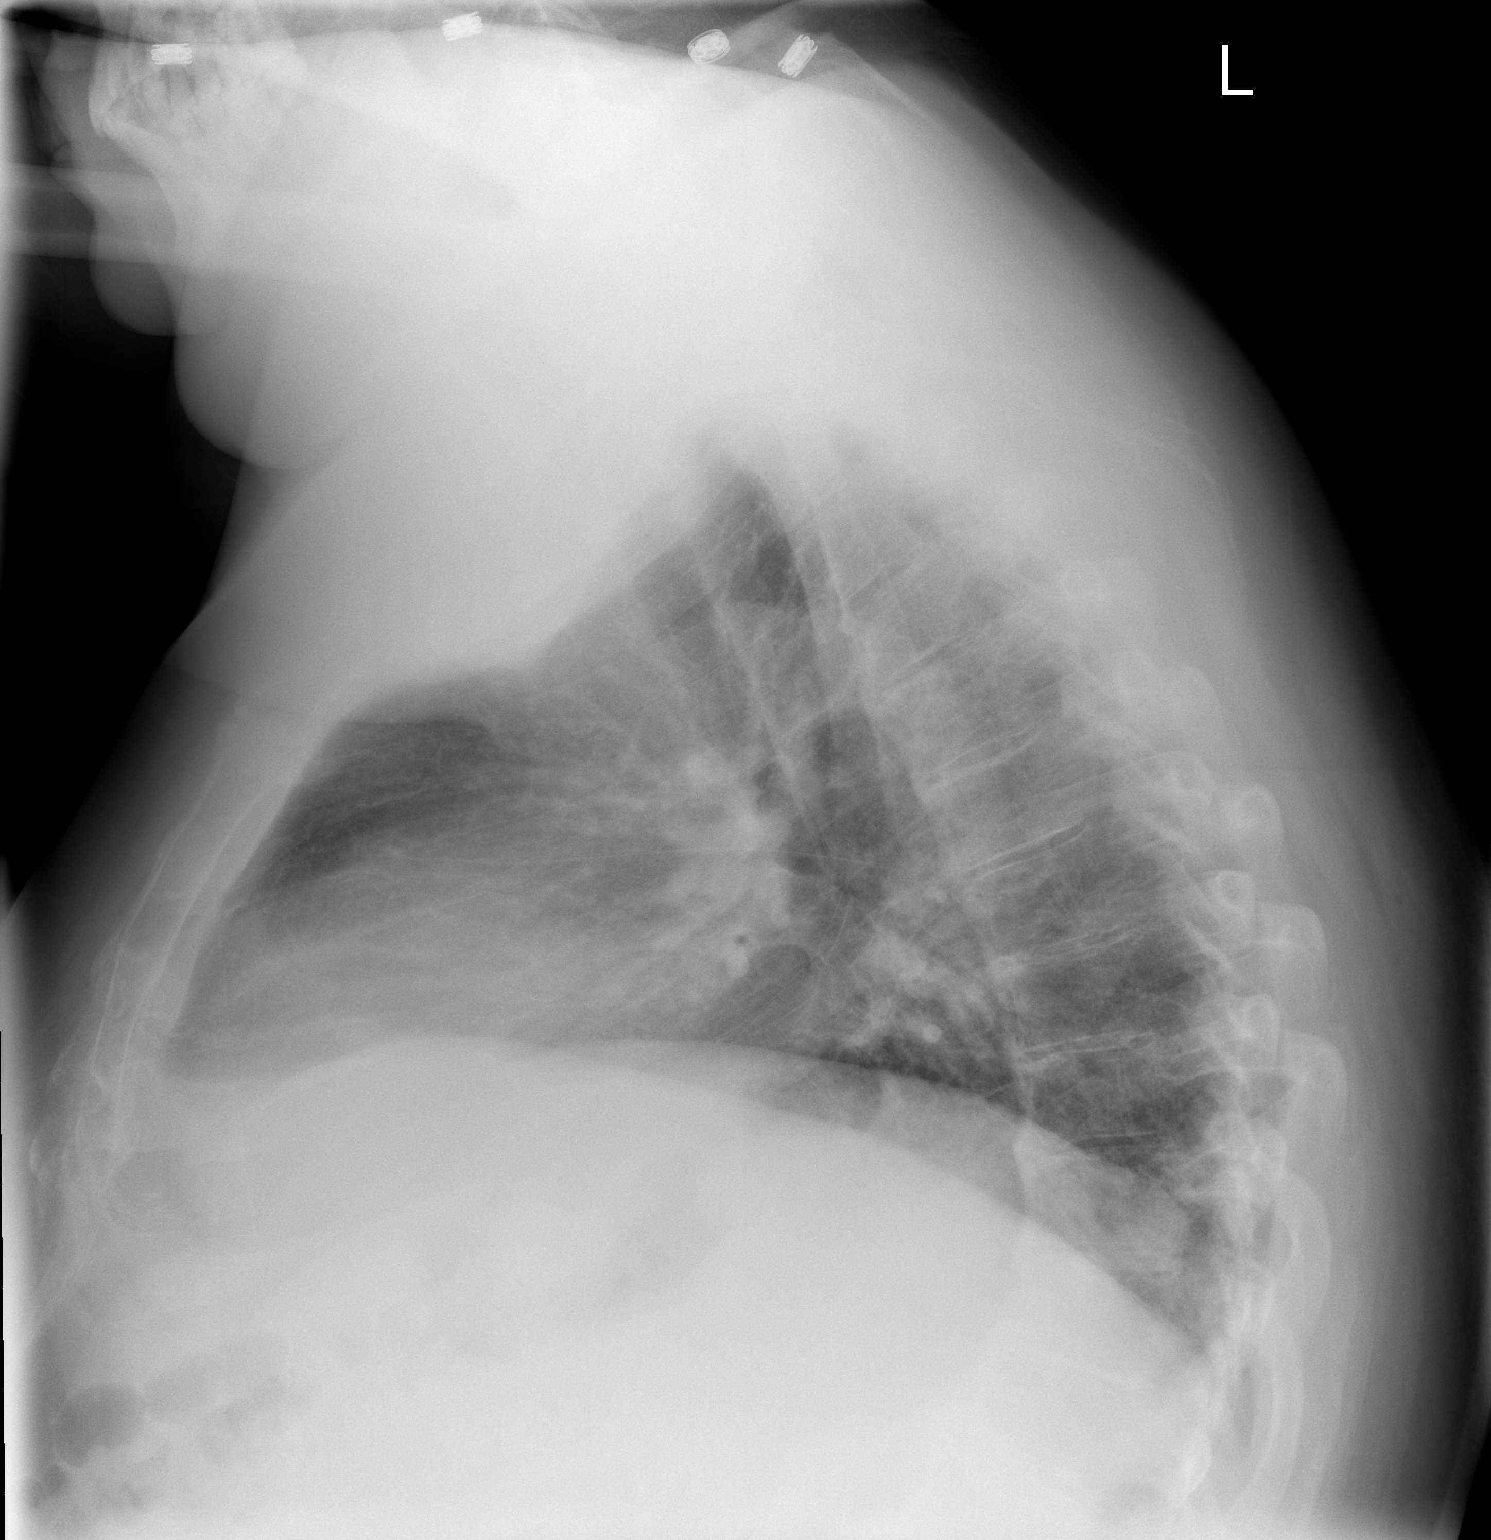

[2 of 2 positions shown; findings below may reference images not displayed]

FINDINGS: Lung volumes are low but the lungs are clear. Heart size is normal.
No pneumothorax or pleural effusion.
IMPRESSION: No acute finding in a low volume chest.

## 2016-02-01 IMAGING — CT CT HEAD W/O CM
2 series · 15 of 30 positions shown, 19 images · non-contrast
Comparison: Noncontrast CT scan of the brain January 18, 2014

CLINICAL DATA: Numbness and facial droop

EXAM:
CT HEAD WITHOUT CONTRAST
TECHNIQUE: Contiguous axial images were obtained from the base of the skull
through the vertex without intravenous contrast.

[Series 201: head w/o, idose (1) · axial · non-contrast · 0.43mm/px · z∈[+48,+178]mm · 13 of 32 slices shown, 17 images]
[im 3/32  brain]
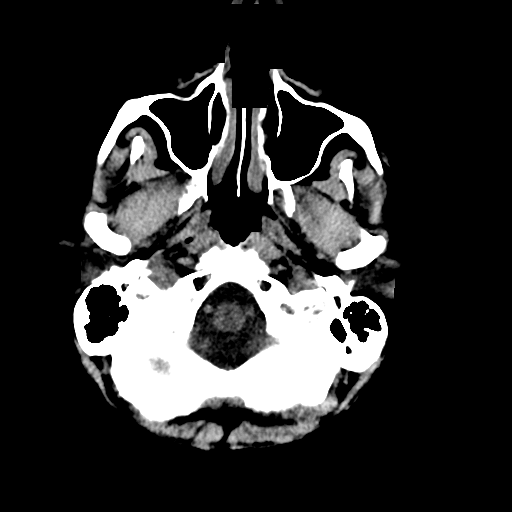
[im 3/32  bone]
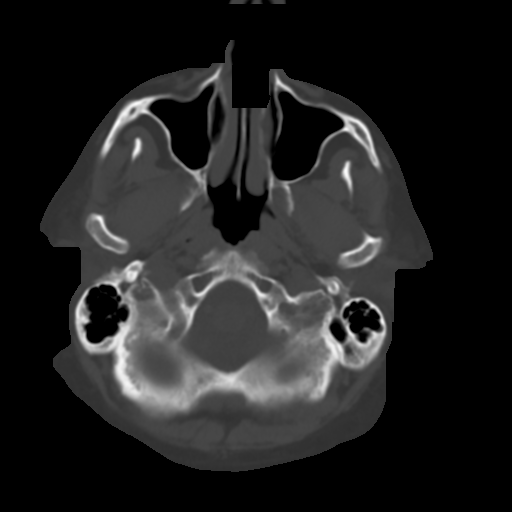
[im 5/32  brain]
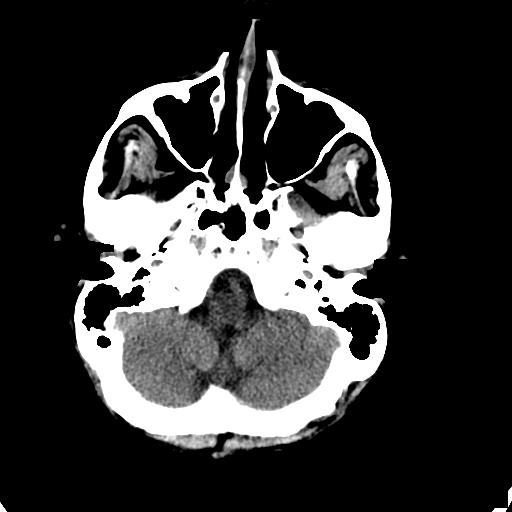
[im 7/32  brain]
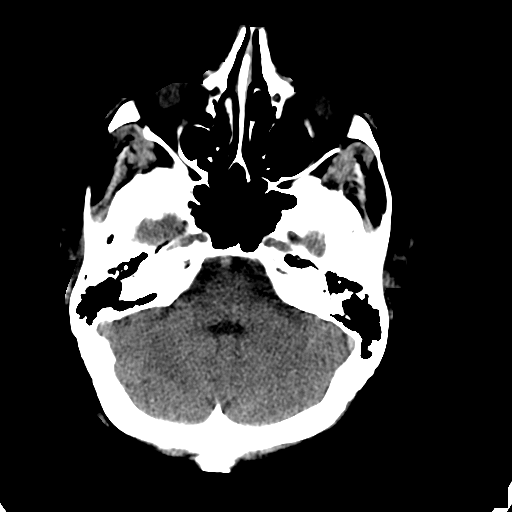
[im 9/32  brain]
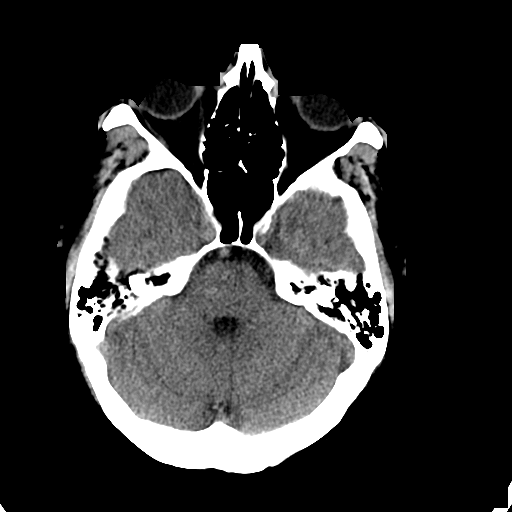
[im 12/32  brain]
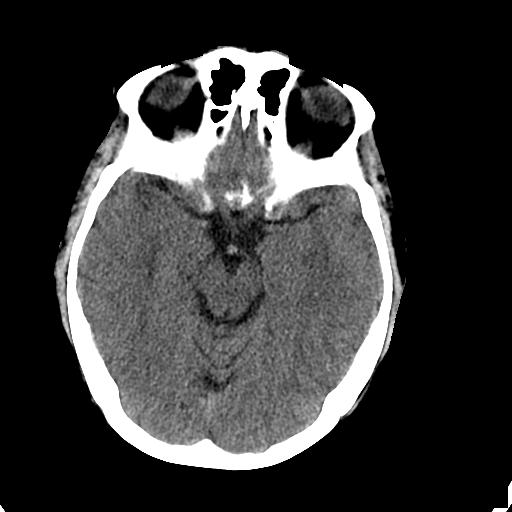
[im 12/32  bone]
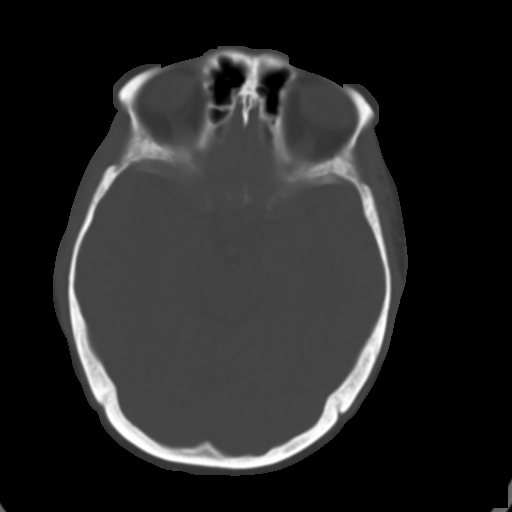
[im 14/32  brain]
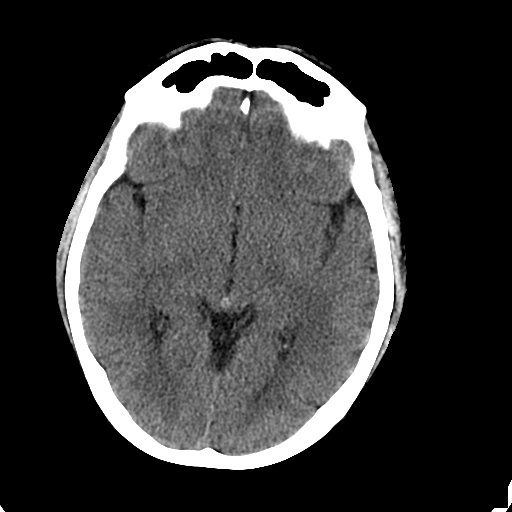
[im 16/32  brain]
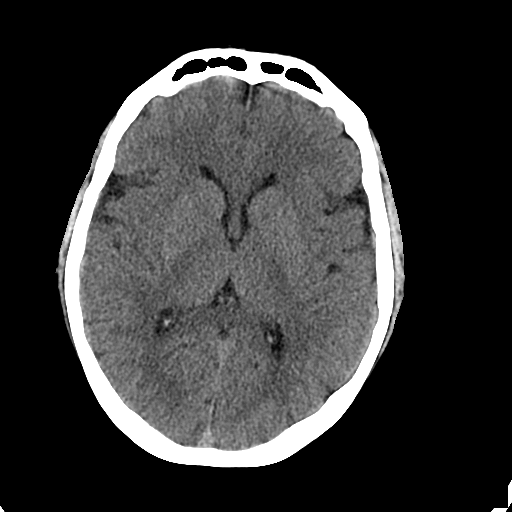
[im 18/32  brain]
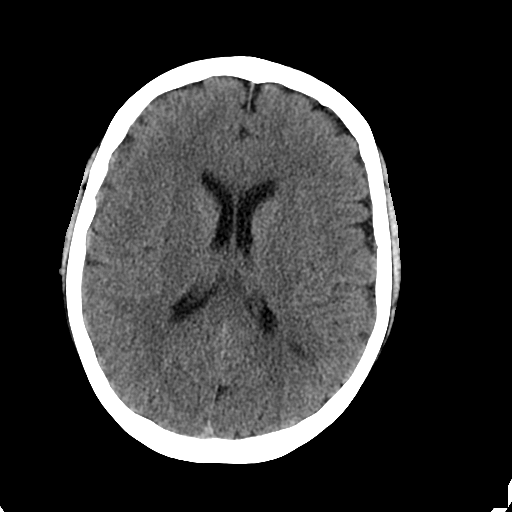
[im 20/32  brain]
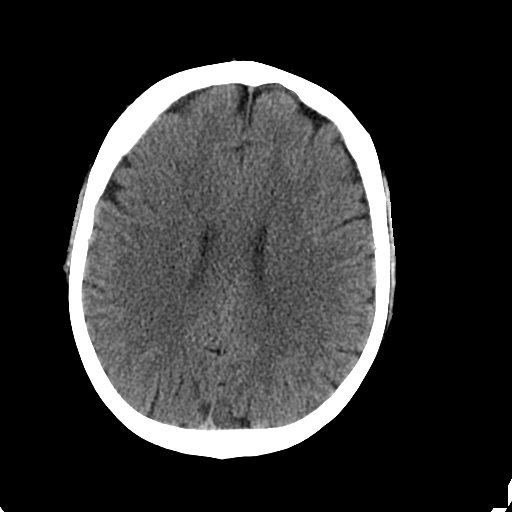
[im 20/32  bone]
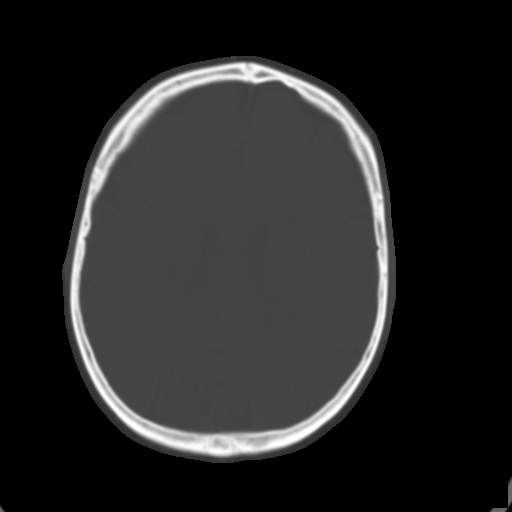
[im 23/32  brain]
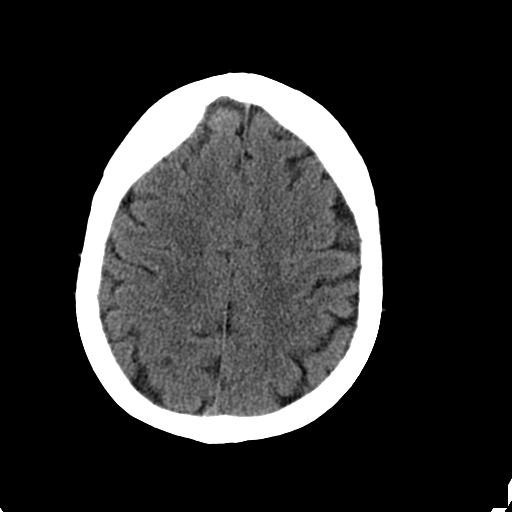
[im 25/32  brain]
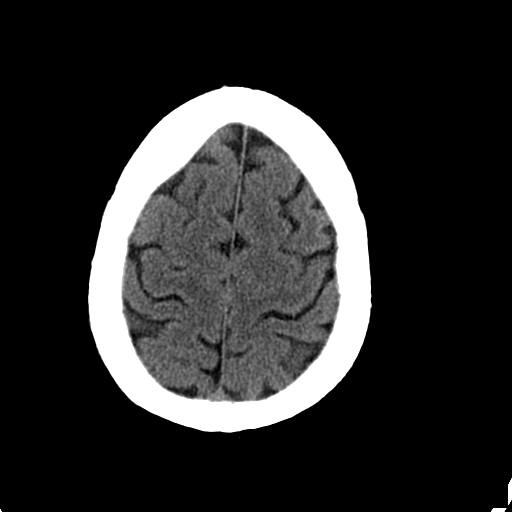
[im 27/32  brain]
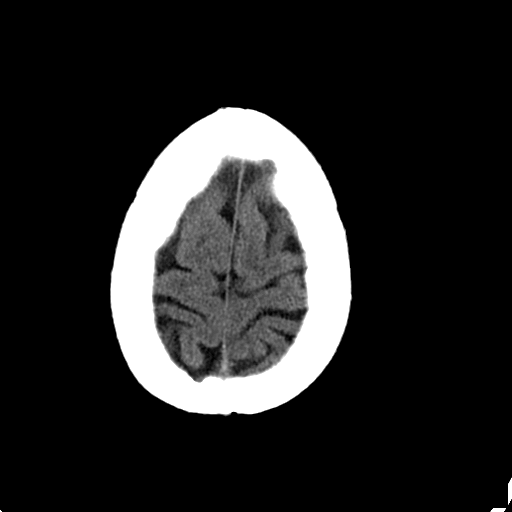
[im 29/32  brain]
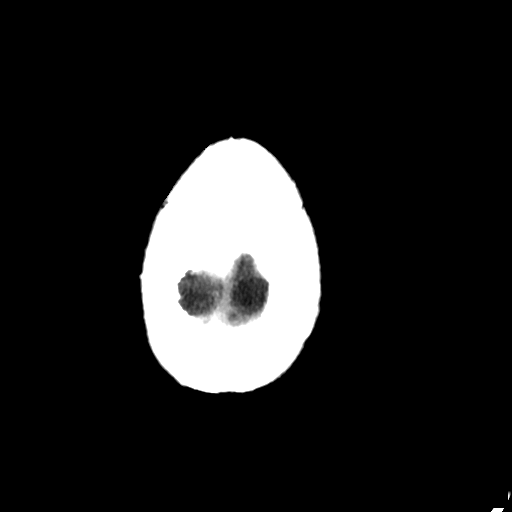
[im 29/32  bone]
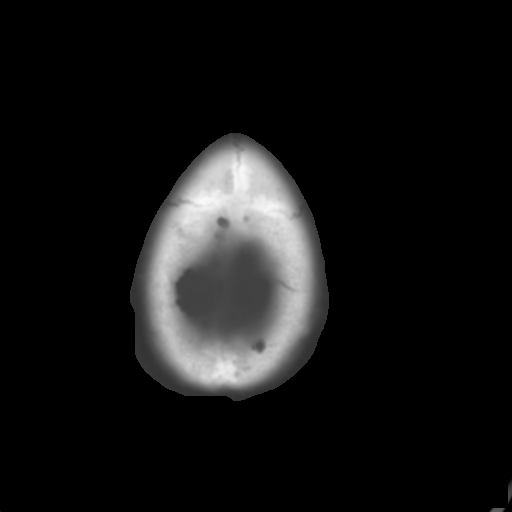

[Series 202: head w/o bone, idose (1) · axial · non-contrast · 0.43mm/px · z∈[+48,+68]mm · 2 of 32 slices shown]
[im 3/32  bone]
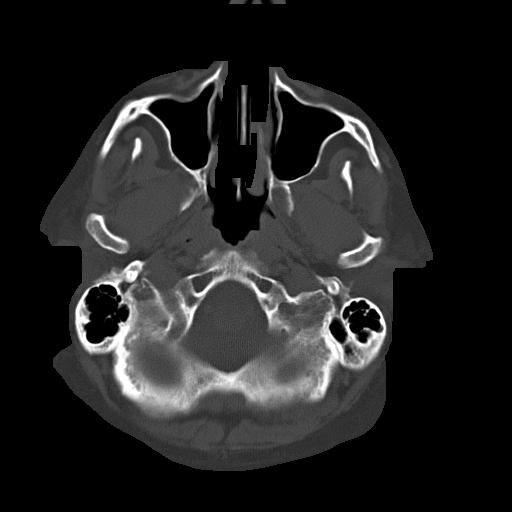
[im 7/32  bone]
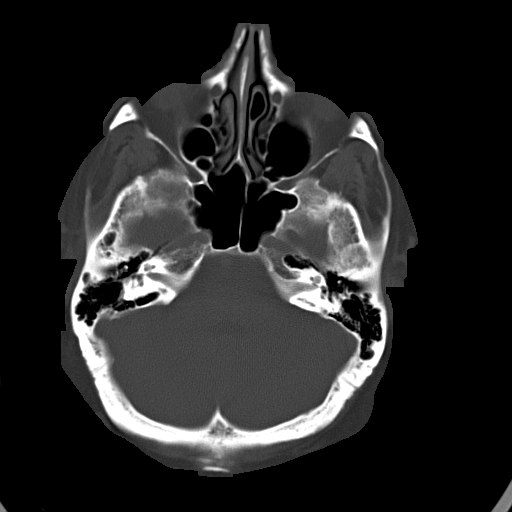

[15 of 30 positions shown; findings below may reference images not displayed]

FINDINGS: The ventricles are normal in size and position. There is no
intracranial hemorrhage nor intracranial mass effect. No acute
ischemic change is demonstrated. There are mild deep white matter
hypodensities in both cerebral hemispheres. The cerebellum and
brainstem are normal.

The observed paranasal sinuses and mastoid air cells are clear.
There is no acute skull fracture nor lytic or blastic bony lesion.
New caps the internal auditory canals exhibit normal caliber.
IMPRESSION: 1. There is no acute intracranial hemorrhage nor evidence of acute
ischemic change or mass effect.
2. There are white matter hypodensities consistent with chronic
small vessel ischemia.
3. There is no acute skull fracture.

## 2016-03-25 DIAGNOSIS — E784 Other hyperlipidemia: Secondary | ICD-10-CM | POA: Diagnosis not present

## 2016-03-25 DIAGNOSIS — E668 Other obesity: Secondary | ICD-10-CM | POA: Diagnosis not present

## 2016-03-25 DIAGNOSIS — M25511 Pain in right shoulder: Secondary | ICD-10-CM | POA: Diagnosis not present

## 2016-03-25 DIAGNOSIS — E119 Type 2 diabetes mellitus without complications: Secondary | ICD-10-CM | POA: Diagnosis not present

## 2016-07-24 DIAGNOSIS — E119 Type 2 diabetes mellitus without complications: Secondary | ICD-10-CM | POA: Diagnosis not present

## 2016-07-24 DIAGNOSIS — F419 Anxiety disorder, unspecified: Secondary | ICD-10-CM | POA: Diagnosis not present

## 2016-07-24 DIAGNOSIS — G2581 Restless legs syndrome: Secondary | ICD-10-CM | POA: Diagnosis not present

## 2016-07-24 DIAGNOSIS — G629 Polyneuropathy, unspecified: Secondary | ICD-10-CM | POA: Diagnosis not present

## 2016-12-24 ENCOUNTER — Encounter (HOSPITAL_COMMUNITY): Payer: Self-pay

## 2017-02-12 DIAGNOSIS — G4733 Obstructive sleep apnea (adult) (pediatric): Secondary | ICD-10-CM | POA: Diagnosis not present

## 2017-02-12 DIAGNOSIS — Z9884 Bariatric surgery status: Secondary | ICD-10-CM | POA: Diagnosis not present

## 2017-03-29 DIAGNOSIS — Z9884 Bariatric surgery status: Secondary | ICD-10-CM | POA: Diagnosis not present

## 2017-03-29 DIAGNOSIS — E119 Type 2 diabetes mellitus without complications: Secondary | ICD-10-CM | POA: Diagnosis not present

## 2017-03-29 DIAGNOSIS — M25511 Pain in right shoulder: Secondary | ICD-10-CM | POA: Diagnosis not present

## 2017-03-29 DIAGNOSIS — Z125 Encounter for screening for malignant neoplasm of prostate: Secondary | ICD-10-CM | POA: Diagnosis not present

## 2017-03-29 DIAGNOSIS — G2581 Restless legs syndrome: Secondary | ICD-10-CM | POA: Diagnosis not present

## 2017-03-29 DIAGNOSIS — R8290 Unspecified abnormal findings in urine: Secondary | ICD-10-CM | POA: Diagnosis not present

## 2017-03-29 DIAGNOSIS — R82998 Other abnormal findings in urine: Secondary | ICD-10-CM | POA: Diagnosis not present

## 2017-03-29 DIAGNOSIS — F418 Other specified anxiety disorders: Secondary | ICD-10-CM | POA: Diagnosis not present

## 2017-03-29 DIAGNOSIS — I10 Essential (primary) hypertension: Secondary | ICD-10-CM | POA: Diagnosis not present

## 2017-03-30 ENCOUNTER — Encounter: Payer: Self-pay | Admitting: Skilled Nursing Facility1

## 2017-03-30 ENCOUNTER — Encounter: Payer: BLUE CROSS/BLUE SHIELD | Attending: General Surgery | Admitting: Skilled Nursing Facility1

## 2017-03-30 DIAGNOSIS — E669 Obesity, unspecified: Secondary | ICD-10-CM | POA: Diagnosis not present

## 2017-03-30 DIAGNOSIS — E119 Type 2 diabetes mellitus without complications: Secondary | ICD-10-CM

## 2017-03-30 DIAGNOSIS — Z713 Dietary counseling and surveillance: Secondary | ICD-10-CM | POA: Diagnosis not present

## 2017-03-30 NOTE — Patient Instructions (Signed)
-  Try the baritastic app to set alarms  -Take your multivitamin 2 hours from your calcium  -eat every and all foods  -Eat about every 3-5 hours  -Take a specific multivitamin   -Aim for 5 bottles of water

## 2017-03-30 NOTE — Progress Notes (Addendum)
RYGB Surgery  Primary concerns today: Post-operative Bariatric Surgery Nutrition Management.  Pt states his A1C 5.2 and does not check his blood sugar. 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 Pt states he has no complaints and is doing very well.   TANITA  BODY COMP RESULTS  03/30/2017   BMI (kg/m^2) 30   Fat Mass (lbs) 60   Fat Free Mass (lbs) 167.6   Total Body Water (lbs) 114    24-hr recall: B (AM): 2 eggs and 2 pieces of [place] bacon maybe 1 piece of toast or englsh muffin Snk (AM): belvita breakfast biscuit  L (PM): deli sandwich sometimes a little of the bread Snk (PM):  Chocolate sometimes  D (PM): meat and vegetable and some mac n cheese or mashed potatoes  Snk (PM):   Fluid intake: coffee, water, tea with stevia, sugar free natures twist  Estimated total protein intake: 80  Medications: See List Supplementation: sublingual b12 and calcium  Using straws: no Drinking while eating: no Having you been chewing well:no Chewing/swallowing difficulties: no Changes in vision: no Changes to mood/headaches: no Hair loss/Cahnges to skin/Changes to nails: normal  Any difficulty focusing or concentrating: no Sweating: no Dizziness/Lightheaded:  Palpitations: no  Carbonated beverages: no N/V/D/C/GAS: no Abdominal Pain: no Dumping syndrome: no   Recent physical activity:  Working all the time up and down stairs   Progress Towards Goal(s):  In progress.   Nutritional Diagnosis:  [place]-3.3 Overweight/obesity related to past poor dietary habits and physical inactivity as evidenced by patient w/ RYGB surgery following dietary guidelines for continued weight loss.    Intervention:  Nutrition counseling. Dietitian educated the pt on current healthy plan having had the RYGB. Goals: -Try the baritastic app to set alarms -Take your multivitamin 2 hours from your calcium -eat every and all foods -Eat about  every 3-5 hours -Take a specific multivitamin  -Aim for 5 bottles of water  Teaching Method Utilized:  Visual Auditory Hands on  Barriers to learning/adherence to lifestyle change: none identified  Demonstrated degree of understanding via:  Teach Back   Monitoring/Evaluation:  Dietary intake, exercise, and body weight.

## 2017-03-31 DIAGNOSIS — M25511 Pain in right shoulder: Secondary | ICD-10-CM | POA: Diagnosis not present

## 2017-03-31 DIAGNOSIS — G8929 Other chronic pain: Secondary | ICD-10-CM | POA: Diagnosis not present

## 2017-03-31 DIAGNOSIS — M25512 Pain in left shoulder: Secondary | ICD-10-CM | POA: Diagnosis not present

## 2017-04-07 DIAGNOSIS — Z0000 Encounter for general adult medical examination without abnormal findings: Secondary | ICD-10-CM | POA: Diagnosis not present

## 2017-04-07 DIAGNOSIS — Z6830 Body mass index (BMI) 30.0-30.9, adult: Secondary | ICD-10-CM | POA: Diagnosis not present

## 2017-04-07 DIAGNOSIS — E7849 Other hyperlipidemia: Secondary | ICD-10-CM | POA: Diagnosis not present

## 2017-04-07 DIAGNOSIS — I10 Essential (primary) hypertension: Secondary | ICD-10-CM | POA: Diagnosis not present

## 2017-04-07 DIAGNOSIS — Z1389 Encounter for screening for other disorder: Secondary | ICD-10-CM | POA: Diagnosis not present

## 2017-04-07 DIAGNOSIS — M25511 Pain in right shoulder: Secondary | ICD-10-CM | POA: Diagnosis not present

## 2017-04-07 DIAGNOSIS — M25512 Pain in left shoulder: Secondary | ICD-10-CM | POA: Diagnosis not present

## 2017-04-28 DIAGNOSIS — Z1212 Encounter for screening for malignant neoplasm of rectum: Secondary | ICD-10-CM | POA: Diagnosis not present

## 2017-05-11 ENCOUNTER — Encounter: Payer: Self-pay | Admitting: Internal Medicine

## 2018-04-14 DIAGNOSIS — Z125 Encounter for screening for malignant neoplasm of prostate: Secondary | ICD-10-CM | POA: Diagnosis not present

## 2018-04-14 DIAGNOSIS — Z0000 Encounter for general adult medical examination without abnormal findings: Secondary | ICD-10-CM | POA: Diagnosis not present

## 2018-04-14 DIAGNOSIS — E559 Vitamin D deficiency, unspecified: Secondary | ICD-10-CM | POA: Diagnosis not present

## 2018-04-14 DIAGNOSIS — E119 Type 2 diabetes mellitus without complications: Secondary | ICD-10-CM | POA: Diagnosis not present

## 2018-04-14 DIAGNOSIS — R82998 Other abnormal findings in urine: Secondary | ICD-10-CM | POA: Diagnosis not present

## 2018-04-29 DIAGNOSIS — I10 Essential (primary) hypertension: Secondary | ICD-10-CM | POA: Diagnosis not present

## 2018-04-29 DIAGNOSIS — E1169 Type 2 diabetes mellitus with other specified complication: Secondary | ICD-10-CM | POA: Diagnosis not present

## 2018-04-29 DIAGNOSIS — E7849 Other hyperlipidemia: Secondary | ICD-10-CM | POA: Diagnosis not present

## 2018-04-29 DIAGNOSIS — E559 Vitamin D deficiency, unspecified: Secondary | ICD-10-CM | POA: Diagnosis not present

## 2018-04-29 DIAGNOSIS — Z0000 Encounter for general adult medical examination without abnormal findings: Secondary | ICD-10-CM | POA: Diagnosis not present

## 2018-04-29 DIAGNOSIS — Z1389 Encounter for screening for other disorder: Secondary | ICD-10-CM | POA: Diagnosis not present

## 2018-05-21 DIAGNOSIS — W11XXXA Fall on and from ladder, initial encounter: Secondary | ICD-10-CM | POA: Diagnosis not present

## 2018-05-21 DIAGNOSIS — M25512 Pain in left shoulder: Secondary | ICD-10-CM | POA: Diagnosis not present

## 2018-05-21 DIAGNOSIS — S42352A Displaced comminuted fracture of shaft of humerus, left arm, initial encounter for closed fracture: Secondary | ICD-10-CM | POA: Diagnosis not present

## 2018-05-21 DIAGNOSIS — S42215A Unspecified nondisplaced fracture of surgical neck of left humerus, initial encounter for closed fracture: Secondary | ICD-10-CM | POA: Diagnosis not present

## 2018-05-25 DIAGNOSIS — S42355A Nondisplaced comminuted fracture of shaft of humerus, left arm, initial encounter for closed fracture: Secondary | ICD-10-CM | POA: Diagnosis not present

## 2018-05-30 DIAGNOSIS — S42252A Displaced fracture of greater tuberosity of left humerus, initial encounter for closed fracture: Secondary | ICD-10-CM | POA: Diagnosis not present

## 2018-05-30 DIAGNOSIS — S42292A Other displaced fracture of upper end of left humerus, initial encounter for closed fracture: Secondary | ICD-10-CM | POA: Diagnosis not present

## 2018-05-30 DIAGNOSIS — G8918 Other acute postprocedural pain: Secondary | ICD-10-CM | POA: Diagnosis not present

## 2018-05-30 DIAGNOSIS — S42202A Unspecified fracture of upper end of left humerus, initial encounter for closed fracture: Secondary | ICD-10-CM | POA: Diagnosis not present

## 2018-05-30 DIAGNOSIS — X58XXXA Exposure to other specified factors, initial encounter: Secondary | ICD-10-CM | POA: Diagnosis not present

## 2018-05-30 DIAGNOSIS — Y999 Unspecified external cause status: Secondary | ICD-10-CM | POA: Diagnosis not present

## 2018-06-10 DIAGNOSIS — S42202D Unspecified fracture of upper end of left humerus, subsequent encounter for fracture with routine healing: Secondary | ICD-10-CM | POA: Diagnosis not present

## 2018-09-05 DIAGNOSIS — S42292D Other displaced fracture of upper end of left humerus, subsequent encounter for fracture with routine healing: Secondary | ICD-10-CM | POA: Diagnosis not present

## 2018-09-05 DIAGNOSIS — M25512 Pain in left shoulder: Secondary | ICD-10-CM | POA: Diagnosis not present

## 2018-09-26 DIAGNOSIS — M25532 Pain in left wrist: Secondary | ICD-10-CM | POA: Diagnosis not present

## 2018-09-26 DIAGNOSIS — S42352S Displaced comminuted fracture of shaft of humerus, left arm, sequela: Secondary | ICD-10-CM | POA: Diagnosis not present

## 2018-09-26 DIAGNOSIS — M25512 Pain in left shoulder: Secondary | ICD-10-CM | POA: Diagnosis not present

## 2018-09-26 DIAGNOSIS — M79602 Pain in left arm: Secondary | ICD-10-CM | POA: Diagnosis not present

## 2018-09-26 DIAGNOSIS — S42292D Other displaced fracture of upper end of left humerus, subsequent encounter for fracture with routine healing: Secondary | ICD-10-CM | POA: Diagnosis not present

## 2018-10-11 DIAGNOSIS — M25512 Pain in left shoulder: Secondary | ICD-10-CM | POA: Diagnosis not present

## 2018-10-11 DIAGNOSIS — S42292D Other displaced fracture of upper end of left humerus, subsequent encounter for fracture with routine healing: Secondary | ICD-10-CM | POA: Diagnosis not present

## 2018-10-18 DIAGNOSIS — M25512 Pain in left shoulder: Secondary | ICD-10-CM | POA: Diagnosis not present

## 2018-10-18 DIAGNOSIS — S42292D Other displaced fracture of upper end of left humerus, subsequent encounter for fracture with routine healing: Secondary | ICD-10-CM | POA: Diagnosis not present

## 2018-10-25 DIAGNOSIS — M25512 Pain in left shoulder: Secondary | ICD-10-CM | POA: Diagnosis not present

## 2018-10-25 DIAGNOSIS — S42292D Other displaced fracture of upper end of left humerus, subsequent encounter for fracture with routine healing: Secondary | ICD-10-CM | POA: Diagnosis not present

## 2018-11-02 DIAGNOSIS — M25512 Pain in left shoulder: Secondary | ICD-10-CM | POA: Diagnosis not present

## 2018-11-02 DIAGNOSIS — S42292D Other displaced fracture of upper end of left humerus, subsequent encounter for fracture with routine healing: Secondary | ICD-10-CM | POA: Diagnosis not present

## 2018-11-15 DIAGNOSIS — M25512 Pain in left shoulder: Secondary | ICD-10-CM | POA: Diagnosis not present

## 2018-11-15 DIAGNOSIS — S42292D Other displaced fracture of upper end of left humerus, subsequent encounter for fracture with routine healing: Secondary | ICD-10-CM | POA: Diagnosis not present

## 2018-12-01 DIAGNOSIS — S42352S Displaced comminuted fracture of shaft of humerus, left arm, sequela: Secondary | ICD-10-CM | POA: Diagnosis not present

## 2018-12-01 DIAGNOSIS — M79622 Pain in left upper arm: Secondary | ICD-10-CM | POA: Diagnosis not present
